# Patient Record
Sex: Female | Born: 1980 | Race: White | Hispanic: No | Marital: Married | Smoking: Former smoker
Health system: Southern US, Community
[De-identification: ages and names within clinical notes are randomized; demographics above are authoritative.]

## PROBLEM LIST (undated history)

## (undated) DIAGNOSIS — E7800 Pure hypercholesterolemia, unspecified: Secondary | ICD-10-CM

## (undated) DIAGNOSIS — E559 Vitamin D deficiency, unspecified: Secondary | ICD-10-CM

## (undated) DIAGNOSIS — E119 Type 2 diabetes mellitus without complications: Secondary | ICD-10-CM

## (undated) DIAGNOSIS — I10 Essential (primary) hypertension: Secondary | ICD-10-CM

## (undated) HISTORY — DX: Vitamin D deficiency, unspecified: E55.9

## (undated) HISTORY — DX: Pure hypercholesterolemia, unspecified: E78.00

## (undated) HISTORY — PX: WISDOM TOOTH EXTRACTION: SHX21

---

## 2020-05-01 ENCOUNTER — Encounter (HOSPITAL_COMMUNITY): Payer: Self-pay | Admitting: Obstetrics and Gynecology

## 2020-05-01 ENCOUNTER — Inpatient Hospital Stay (HOSPITAL_COMMUNITY): Payer: 59 | Admitting: Anesthesiology

## 2020-05-01 ENCOUNTER — Inpatient Hospital Stay (HOSPITAL_BASED_OUTPATIENT_CLINIC_OR_DEPARTMENT_OTHER): Payer: 59

## 2020-05-01 ENCOUNTER — Encounter (HOSPITAL_COMMUNITY)
Admission: AD | Disposition: A | Discharge status: Home / Self Care | Payer: Self-pay | Source: Home / Self Care | Attending: Obstetrics and Gynecology

## 2020-05-01 ENCOUNTER — Inpatient Hospital Stay (HOSPITAL_COMMUNITY)
Admission: AD | Admit: 2020-05-01 | Discharge: 2020-05-05 | DRG: 787 | Disposition: A | Discharge status: Home / Self Care | Payer: 59 | Attending: Obstetrics and Gynecology | Admitting: Obstetrics and Gynecology

## 2020-05-01 ENCOUNTER — Other Ambulatory Visit: Payer: Self-pay

## 2020-05-01 DIAGNOSIS — O368330 Maternal care for abnormalities of the fetal heart rate or rhythm, third trimester, not applicable or unspecified: Secondary | ICD-10-CM | POA: Diagnosis not present

## 2020-05-01 DIAGNOSIS — Z9889 Other specified postprocedural states: Secondary | ICD-10-CM

## 2020-05-01 DIAGNOSIS — O09513 Supervision of elderly primigravida, third trimester: Secondary | ICD-10-CM

## 2020-05-01 DIAGNOSIS — O114 Pre-existing hypertension with pre-eclampsia, complicating childbirth: Secondary | ICD-10-CM | POA: Diagnosis present

## 2020-05-01 DIAGNOSIS — O4103X0 Oligohydramnios, third trimester, not applicable or unspecified: Secondary | ICD-10-CM | POA: Diagnosis present

## 2020-05-01 DIAGNOSIS — Z364 Encounter for antenatal screening for fetal growth retardation: Secondary | ICD-10-CM | POA: Diagnosis not present

## 2020-05-01 DIAGNOSIS — R030 Elevated blood-pressure reading, without diagnosis of hypertension: Secondary | ICD-10-CM | POA: Diagnosis not present

## 2020-05-01 DIAGNOSIS — Z3A32 32 weeks gestation of pregnancy: Secondary | ICD-10-CM

## 2020-05-01 DIAGNOSIS — O24420 Gestational diabetes mellitus in childbirth, diet controlled: Secondary | ICD-10-CM | POA: Diagnosis present

## 2020-05-01 DIAGNOSIS — O365930 Maternal care for other known or suspected poor fetal growth, third trimester, not applicable or unspecified: Secondary | ICD-10-CM

## 2020-05-01 DIAGNOSIS — O368130 Decreased fetal movements, third trimester, not applicable or unspecified: Principal | ICD-10-CM

## 2020-05-01 DIAGNOSIS — Z20822 Contact with and (suspected) exposure to covid-19: Secondary | ICD-10-CM | POA: Diagnosis present

## 2020-05-01 DIAGNOSIS — O365990 Maternal care for other known or suspected poor fetal growth, unspecified trimester, not applicable or unspecified: Secondary | ICD-10-CM

## 2020-05-01 DIAGNOSIS — O10013 Pre-existing essential hypertension complicating pregnancy, third trimester: Secondary | ICD-10-CM

## 2020-05-01 DIAGNOSIS — O368190 Decreased fetal movements, unspecified trimester, not applicable or unspecified: Secondary | ICD-10-CM

## 2020-05-01 DIAGNOSIS — O1002 Pre-existing essential hypertension complicating childbirth: Secondary | ICD-10-CM | POA: Diagnosis present

## 2020-05-01 DIAGNOSIS — O1493 Unspecified pre-eclampsia, third trimester: Secondary | ICD-10-CM

## 2020-05-01 DIAGNOSIS — O1490 Unspecified pre-eclampsia, unspecified trimester: Secondary | ICD-10-CM

## 2020-05-01 DIAGNOSIS — O99893 Other specified diseases and conditions complicating puerperium: Secondary | ICD-10-CM | POA: Diagnosis not present

## 2020-05-01 DIAGNOSIS — O368390 Maternal care for abnormalities of the fetal heart rate or rhythm, unspecified trimester, not applicable or unspecified: Secondary | ICD-10-CM

## 2020-05-01 DIAGNOSIS — O24419 Gestational diabetes mellitus in pregnancy, unspecified control: Secondary | ICD-10-CM

## 2020-05-01 DIAGNOSIS — Z87891 Personal history of nicotine dependence: Secondary | ICD-10-CM | POA: Diagnosis not present

## 2020-05-01 HISTORY — DX: Essential (primary) hypertension: I10

## 2020-05-01 HISTORY — DX: Type 2 diabetes mellitus without complications: E11.9

## 2020-05-01 LAB — CBC
HCT: 43.3 % (ref 36.0–46.0)
Hemoglobin: 14.7 g/dL (ref 12.0–15.0)
MCH: 32.3 pg (ref 26.0–34.0)
MCHC: 33.9 g/dL (ref 30.0–36.0)
MCV: 95.2 fL (ref 80.0–100.0)
Platelets: 241 10*3/uL (ref 150–400)
RBC: 4.55 MIL/uL (ref 3.87–5.11)
RDW: 11.9 % (ref 11.5–15.5)
WBC: 15.9 10*3/uL — ABNORMAL HIGH (ref 4.0–10.5)
nRBC: 0 % (ref 0.0–0.2)

## 2020-05-01 LAB — PROTEIN / CREATININE RATIO, URINE
Creatinine, Urine: 85.58 mg/dL
Protein Creatinine Ratio: 5.47 mg/mg{Cre} — ABNORMAL HIGH (ref 0.00–0.15)
Total Protein, Urine: 468 mg/dL

## 2020-05-01 LAB — COMPREHENSIVE METABOLIC PANEL
ALT: 32 U/L (ref 0–44)
AST: 26 U/L (ref 15–41)
Albumin: 2.9 g/dL — ABNORMAL LOW (ref 3.5–5.0)
Alkaline Phosphatase: 124 U/L (ref 38–126)
Anion gap: 13 (ref 5–15)
BUN: 9 mg/dL (ref 6–20)
CO2: 21 mmol/L — ABNORMAL LOW (ref 22–32)
Calcium: 9.1 mg/dL (ref 8.9–10.3)
Chloride: 100 mmol/L (ref 98–111)
Creatinine, Ser: 0.66 mg/dL (ref 0.44–1.00)
GFR calc Af Amer: >60 mL/min (ref >60)
GFR calc non Af Amer: >60 mL/min (ref >60)
Glucose, Bld: 84 mg/dL (ref 70–99)
Potassium: 4 mmol/L (ref 3.5–5.1)
Sodium: 134 mmol/L — ABNORMAL LOW (ref 135–145)
Total Bilirubin: 0.7 mg/dL (ref 0.3–1.2)
Total Protein: 6.1 g/dL — ABNORMAL LOW (ref 6.5–8.1)

## 2020-05-01 LAB — URINALYSIS, ROUTINE W REFLEX MICROSCOPIC
Bilirubin Urine: NEGATIVE
Glucose, UA: NEGATIVE mg/dL
Hgb urine dipstick: NEGATIVE
Ketones, ur: NEGATIVE mg/dL
Leukocytes,Ua: NEGATIVE
Nitrite: NEGATIVE
Protein, ur: >=300 mg/dL — AB
Specific Gravity, Urine: 1.011 (ref 1.005–1.030)
pH: 5 (ref 5.0–8.0)

## 2020-05-01 LAB — SARS CORONAVIRUS 2 BY RT PCR (HOSPITAL ORDER, PERFORMED IN ~~LOC~~ HOSPITAL LAB): SARS Coronavirus 2: NEGATIVE

## 2020-05-01 LAB — TYPE AND SCREEN
ABO/RH(D): O POS
Antibody Screen: NEGATIVE

## 2020-05-01 LAB — GLUCOSE, CAPILLARY: Glucose-Capillary: 72 mg/dL (ref 70–99)

## 2020-05-01 LAB — HEPATITIS B SURFACE ANTIGEN: Hepatitis B Surface Ag: NONREACTIVE

## 2020-05-01 SURGERY — Surgical Case
Anesthesia: Spinal | Site: Abdomen | Wound class: Clean Contaminated

## 2020-05-01 MED ORDER — HYDROMORPHONE HCL 1 MG/ML IJ SOLN: 1.0000 mg | INTRAMUSCULAR | Status: DC | PRN

## 2020-05-01 MED ORDER — LACTATED RINGERS IV SOLN: INTRAVENOUS | Status: DC

## 2020-05-01 MED ORDER — LABETALOL HCL 5 MG/ML IV SOLN: 20.0000 mg | INTRAVENOUS | Status: DC | PRN

## 2020-05-01 MED ORDER — ENOXAPARIN SODIUM 40 MG/0.4ML ~~LOC~~ SOLN
40.0000 mg | SUBCUTANEOUS | Status: DC
  Administered 2020-05-02 – 2020-05-04 (×3): 40 mg via SUBCUTANEOUS
  Filled 2020-05-01 (×4): qty 0.4

## 2020-05-01 MED ORDER — LABETALOL HCL 5 MG/ML IV SOLN
INTRAVENOUS | Status: AC
  Administered 2020-05-01: 20 mg via INTRAVENOUS
  Filled 2020-05-01: qty 4

## 2020-05-01 MED ORDER — MAGNESIUM SULFATE 40 GM/1000ML IV SOLN
2.0000 g/h | INTRAVENOUS | Status: AC
  Administered 2020-05-02: 2 g/h via INTRAVENOUS
  Filled 2020-05-01 (×2): qty 1000

## 2020-05-01 MED ORDER — HYDROMORPHONE HCL 1 MG/ML IJ SOLN: 0.2500 mg | INTRAMUSCULAR | Status: DC | PRN

## 2020-05-01 MED ORDER — ZOLPIDEM TARTRATE 5 MG PO TABS: 5.0000 mg | ORAL_TABLET | Freq: Every evening | ORAL | Status: DC | PRN

## 2020-05-01 MED ORDER — DIPHENHYDRAMINE HCL 25 MG PO CAPS: 25.0000 mg | ORAL_CAPSULE | ORAL | Status: DC | PRN

## 2020-05-01 MED ORDER — PRENATAL MULTIVITAMIN CH
1.0000 | ORAL_TABLET | Freq: Every day | ORAL | Status: DC
  Administered 2020-05-02 – 2020-05-04 (×3): 1 via ORAL
  Filled 2020-05-01 (×3): qty 1

## 2020-05-01 MED ORDER — KETOROLAC TROMETHAMINE 30 MG/ML IJ SOLN: 15.0000 mg | INTRAMUSCULAR | Status: DC

## 2020-05-01 MED ORDER — MORPHINE SULFATE (PF) 0.5 MG/ML IJ SOLN
INTRAMUSCULAR | Status: AC
  Filled 2020-05-01: qty 10

## 2020-05-01 MED ORDER — WITCH HAZEL-GLYCERIN EX PADS: 1.0000 "application " | MEDICATED_PAD | CUTANEOUS | Status: DC | PRN

## 2020-05-01 MED ORDER — OXYTOCIN-SODIUM CHLORIDE 30-0.9 UT/500ML-% IV SOLN: 2.5000 [IU]/h | INTRAVENOUS | Status: AC

## 2020-05-01 MED ORDER — CEFAZOLIN SODIUM-DEXTROSE 2-4 GM/100ML-% IV SOLN
INTRAVENOUS | Status: AC
  Filled 2020-05-01: qty 100

## 2020-05-01 MED ORDER — POVIDONE-IODINE 10 % EX SWAB: 2.0000 "application " | Freq: Once | CUTANEOUS | Status: DC

## 2020-05-01 MED ORDER — FAMOTIDINE 20 MG PO TABS
20.0000 mg | ORAL_TABLET | Freq: Every day | ORAL | Status: DC
  Administered 2020-05-02 – 2020-05-04 (×3): 20 mg via ORAL
  Filled 2020-05-01 (×3): qty 1

## 2020-05-01 MED ORDER — NALBUPHINE HCL 10 MG/ML IJ SOLN: 5.0000 mg | INTRAMUSCULAR | Status: DC | PRN

## 2020-05-01 MED ORDER — KETOROLAC TROMETHAMINE 30 MG/ML IJ SOLN
INTRAMUSCULAR | Status: AC
  Filled 2020-05-01: qty 1

## 2020-05-01 MED ORDER — FENTANYL CITRATE (PF) 100 MCG/2ML IJ SOLN
INTRAMUSCULAR | Status: DC | PRN
Start: 2020-05-01 — End: 2020-05-01
  Administered 2020-05-01: 15 ug via INTRAVENOUS

## 2020-05-01 MED ORDER — SODIUM CHLORIDE 0.9 % IR SOLN
Status: DC | PRN
  Administered 2020-05-01: 1000 mL

## 2020-05-01 MED ORDER — NALBUPHINE HCL 10 MG/ML IJ SOLN: 5.0000 mg | Freq: Once | INTRAMUSCULAR | Status: DC | PRN

## 2020-05-01 MED ORDER — SIMETHICONE 80 MG PO CHEW
80.0000 mg | CHEWABLE_TABLET | Freq: Three times a day (TID) | ORAL | Status: DC
  Administered 2020-05-02 – 2020-05-05 (×8): 80 mg via ORAL
  Filled 2020-05-01 (×8): qty 1

## 2020-05-01 MED ORDER — BUPIVACAINE IN DEXTROSE 0.75-8.25 % IT SOLN
INTRATHECAL | Status: DC | PRN
  Administered 2020-05-01: 1.6 mg via INTRATHECAL

## 2020-05-01 MED ORDER — PHENYLEPHRINE 40 MCG/ML (10ML) SYRINGE FOR IV PUSH (FOR BLOOD PRESSURE SUPPORT)
PREFILLED_SYRINGE | INTRAVENOUS | Status: DC | PRN
  Administered 2020-05-01 (×3): 80 ug via INTRAVENOUS

## 2020-05-01 MED ORDER — ENALAPRIL MALEATE 5 MG PO TABS
5.0000 mg | ORAL_TABLET | Freq: Every day | ORAL | Status: DC
  Administered 2020-05-02: 5 mg via ORAL
  Filled 2020-05-01: qty 1

## 2020-05-01 MED ORDER — CEFAZOLIN SODIUM-DEXTROSE 2-4 GM/100ML-% IV SOLN
2.0000 g | INTRAVENOUS | Status: AC
  Administered 2020-05-01: 2 g via INTRAVENOUS
  Filled 2020-05-01: qty 100

## 2020-05-01 MED ORDER — ONDANSETRON HCL 4 MG/2ML IJ SOLN
INTRAMUSCULAR | Status: AC
  Filled 2020-05-01: qty 2

## 2020-05-01 MED ORDER — STERILE WATER FOR IRRIGATION IR SOLN
Status: DC | PRN
  Administered 2020-05-01: 1000 mL

## 2020-05-01 MED ORDER — SOD CITRATE-CITRIC ACID 500-334 MG/5ML PO SOLN
30.0000 mL | ORAL | Status: AC
  Administered 2020-05-01: 30 mL via ORAL
  Filled 2020-05-01: qty 30

## 2020-05-01 MED ORDER — ACETAMINOPHEN 500 MG PO TABS: 1000.0000 mg | ORAL_TABLET | ORAL | Status: DC

## 2020-05-01 MED ORDER — MORPHINE SULFATE (PF) 0.5 MG/ML IJ SOLN
INTRAMUSCULAR | Status: DC | PRN
Start: 2020-05-01 — End: 2020-05-01
  Administered 2020-05-01: .15 mg via EPIDURAL

## 2020-05-01 MED ORDER — OXYTOCIN-SODIUM CHLORIDE 30-0.9 UT/500ML-% IV SOLN
INTRAVENOUS | Status: DC | PRN
  Administered 2020-05-01: 300 mL via INTRAVENOUS

## 2020-05-01 MED ORDER — DIBUCAINE (PERIANAL) 1 % EX OINT: 1.0000 "application " | TOPICAL_OINTMENT | CUTANEOUS | Status: DC | PRN

## 2020-05-01 MED ORDER — BETAMETHASONE SOD PHOS & ACET 6 (3-3) MG/ML IJ SUSP
12.0000 mg | Freq: Once | INTRAMUSCULAR | Status: AC
  Administered 2020-05-01: 12 mg via INTRAMUSCULAR
  Filled 2020-05-01: qty 5

## 2020-05-01 MED ORDER — SCOPOLAMINE 1 MG/3DAYS TD PT72: 1.0000 | MEDICATED_PATCH | Freq: Once | TRANSDERMAL | Status: DC

## 2020-05-01 MED ORDER — NALOXONE HCL 0.4 MG/ML IJ SOLN: 0.4000 mg | INTRAMUSCULAR | Status: DC | PRN

## 2020-05-01 MED ORDER — VENLAFAXINE HCL ER 150 MG PO CP24
150.0000 mg | ORAL_CAPSULE | Freq: Every day | ORAL | Status: DC
  Administered 2020-05-02 – 2020-05-05 (×4): 150 mg via ORAL
  Filled 2020-05-01 (×4): qty 1

## 2020-05-01 MED ORDER — MEPERIDINE HCL 25 MG/ML IJ SOLN: 6.2500 mg | INTRAMUSCULAR | Status: DC | PRN

## 2020-05-01 MED ORDER — MAGNESIUM SULFATE BOLUS VIA INFUSION
4.0000 g | Freq: Once | INTRAVENOUS | Status: AC
  Administered 2020-05-01: 4 g via INTRAVENOUS
  Filled 2020-05-01: qty 1000

## 2020-05-01 MED ORDER — LABETALOL HCL 5 MG/ML IV SOLN: 80.0000 mg | INTRAVENOUS | Status: DC | PRN

## 2020-05-01 MED ORDER — SIMETHICONE 80 MG PO CHEW: 80.0000 mg | CHEWABLE_TABLET | ORAL | Status: DC | PRN

## 2020-05-01 MED ORDER — PHENYLEPHRINE HCL-NACL 20-0.9 MG/250ML-% IV SOLN
INTRAVENOUS | Status: AC
  Filled 2020-05-01: qty 250

## 2020-05-01 MED ORDER — SODIUM CHLORIDE 0.9% FLUSH: 3.0000 mL | INTRAVENOUS | Status: DC | PRN

## 2020-05-01 MED ORDER — KETOROLAC TROMETHAMINE 30 MG/ML IJ SOLN
30.0000 mg | Freq: Once | INTRAMUSCULAR | Status: AC | PRN
  Administered 2020-05-01: 30 mg via INTRAVENOUS

## 2020-05-01 MED ORDER — MENTHOL 3 MG MT LOZG: 1.0000 | LOZENGE | OROMUCOSAL | Status: DC | PRN

## 2020-05-01 MED ORDER — HYDRALAZINE HCL 20 MG/ML IJ SOLN: 10.0000 mg | INTRAMUSCULAR | Status: DC | PRN

## 2020-05-01 MED ORDER — DIPHENHYDRAMINE HCL 25 MG PO CAPS: 25.0000 mg | ORAL_CAPSULE | Freq: Four times a day (QID) | ORAL | Status: DC | PRN

## 2020-05-01 MED ORDER — ONDANSETRON HCL 4 MG/2ML IJ SOLN: 4.0000 mg | Freq: Three times a day (TID) | INTRAMUSCULAR | Status: DC | PRN

## 2020-05-01 MED ORDER — OXYTOCIN-SODIUM CHLORIDE 30-0.9 UT/500ML-% IV SOLN
INTRAVENOUS | Status: AC
  Filled 2020-05-01: qty 500

## 2020-05-01 MED ORDER — FAMOTIDINE IN NACL 20-0.9 MG/50ML-% IV SOLN: 20.0000 mg | Freq: Once | INTRAVENOUS | Status: AC

## 2020-05-01 MED ORDER — DIPHENHYDRAMINE HCL 50 MG/ML IJ SOLN: 12.5000 mg | INTRAMUSCULAR | Status: DC | PRN

## 2020-05-01 MED ORDER — IBUPROFEN 800 MG PO TABS
800.0000 mg | ORAL_TABLET | Freq: Three times a day (TID) | ORAL | Status: DC
  Administered 2020-05-03 – 2020-05-05 (×8): 800 mg via ORAL
  Filled 2020-05-01 (×8): qty 1

## 2020-05-01 MED ORDER — FENTANYL CITRATE (PF) 100 MCG/2ML IJ SOLN
INTRAMUSCULAR | Status: AC
  Filled 2020-05-01: qty 2

## 2020-05-01 MED ORDER — LABETALOL HCL 5 MG/ML IV SOLN
40.0000 mg | INTRAVENOUS | Status: DC | PRN
  Administered 2020-05-01: 40 mg via INTRAVENOUS
  Filled 2020-05-01: qty 8

## 2020-05-01 MED ORDER — FAMOTIDINE IN NACL 20-0.9 MG/50ML-% IV SOLN
INTRAVENOUS | Status: AC
  Administered 2020-05-01: 20 mg
  Filled 2020-05-01: qty 50

## 2020-05-01 MED ORDER — SENNOSIDES-DOCUSATE SODIUM 8.6-50 MG PO TABS
2.0000 | ORAL_TABLET | ORAL | Status: DC
  Administered 2020-05-02 – 2020-05-04 (×4): 2 via ORAL
  Filled 2020-05-01 (×4): qty 2

## 2020-05-01 MED ORDER — ONDANSETRON HCL 4 MG/2ML IJ SOLN
INTRAMUSCULAR | Status: DC | PRN
  Administered 2020-05-01: 4 mg via INTRAVENOUS

## 2020-05-01 MED ORDER — TETANUS-DIPHTH-ACELL PERTUSSIS 5-2.5-18.5 LF-MCG/0.5 IM SUSP: 0.5000 mL | Freq: Once | INTRAMUSCULAR | Status: DC

## 2020-05-01 MED ORDER — NALOXONE HCL 4 MG/10ML IJ SOLN
1.0000 ug/kg/h | INTRAVENOUS | Status: DC | PRN
  Filled 2020-05-01: qty 5

## 2020-05-01 MED ORDER — COCONUT OIL OIL: 1.0000 "application " | TOPICAL_OIL | Status: DC | PRN

## 2020-05-01 MED ORDER — OXYCODONE-ACETAMINOPHEN 5-325 MG PO TABS
1.0000 | ORAL_TABLET | ORAL | Status: DC | PRN
  Administered 2020-05-02 – 2020-05-05 (×3): 1 via ORAL
  Filled 2020-05-01 (×3): qty 1

## 2020-05-01 MED ORDER — KETOROLAC TROMETHAMINE 30 MG/ML IJ SOLN
30.0000 mg | Freq: Four times a day (QID) | INTRAMUSCULAR | Status: AC
  Administered 2020-05-02 (×4): 30 mg via INTRAVENOUS
  Filled 2020-05-01 (×4): qty 1

## 2020-05-01 MED ORDER — PROMETHAZINE HCL 25 MG/ML IJ SOLN: 6.2500 mg | INTRAMUSCULAR | Status: DC | PRN

## 2020-05-01 MED ORDER — SIMETHICONE 80 MG PO CHEW
80.0000 mg | CHEWABLE_TABLET | ORAL | Status: DC
  Administered 2020-05-02 – 2020-05-04 (×4): 80 mg via ORAL
  Filled 2020-05-01 (×4): qty 1

## 2020-05-01 MED ORDER — OXYCODONE-ACETAMINOPHEN 5-325 MG PO TABS: 2.0000 | ORAL_TABLET | ORAL | Status: DC | PRN

## 2020-05-01 MED ORDER — PHENYLEPHRINE HCL-NACL 20-0.9 MG/250ML-% IV SOLN
INTRAVENOUS | Status: DC | PRN
  Administered 2020-05-01: 20 ug/min via INTRAVENOUS

## 2020-05-01 SURGICAL SUPPLY — 39 items
BENZOIN TINCTURE PRP APPL 2/3 (GAUZE/BANDAGES/DRESSINGS) ×3 IMPLANT
CHLORAPREP W/TINT 26ML (MISCELLANEOUS) ×3 IMPLANT
CLAMP CORD UMBIL (MISCELLANEOUS) IMPLANT
CLOSURE WOUND 1/2 X4 (GAUZE/BANDAGES/DRESSINGS) ×1
CLOTH BEACON ORANGE TIMEOUT ST (SAFETY) ×3 IMPLANT
DRAPE C SECTION CLR SCREEN (DRAPES) IMPLANT
DRSG OPSITE POSTOP 4X10 (GAUZE/BANDAGES/DRESSINGS) ×3 IMPLANT
ELECT REM PT RETURN 9FT ADLT (ELECTROSURGICAL) ×3
ELECTRODE REM PT RTRN 9FT ADLT (ELECTROSURGICAL) ×1 IMPLANT
EXTRACTOR VACUUM M CUP 4 TUBE (SUCTIONS) IMPLANT
EXTRACTOR VACUUM M CUP 4' TUBE (SUCTIONS)
GLOVE BIO SURGEON STRL SZ7.5 (GLOVE) ×3 IMPLANT
GLOVE BIOGEL PI IND STRL 7.0 (GLOVE) ×1 IMPLANT
GLOVE BIOGEL PI INDICATOR 7.0 (GLOVE) ×2
GOWN STRL REUS W/TWL 2XL LVL3 (GOWN DISPOSABLE) ×3 IMPLANT
GOWN STRL REUS W/TWL LRG LVL3 (GOWN DISPOSABLE) ×6 IMPLANT
KIT ABG SYR 3ML LUER SLIP (SYRINGE) IMPLANT
NEEDLE HYPO 22GX1.5 SAFETY (NEEDLE) ×3 IMPLANT
NEEDLE HYPO 25X5/8 SAFETYGLIDE (NEEDLE) IMPLANT
NS IRRIG 1000ML POUR BTL (IV SOLUTION) ×3 IMPLANT
PACK C SECTION WH (CUSTOM PROCEDURE TRAY) ×3 IMPLANT
PAD OB MATERNITY 4.3X12.25 (PERSONAL CARE ITEMS) ×3 IMPLANT
PENCIL SMOKE EVAC W/HOLSTER (ELECTROSURGICAL) ×3 IMPLANT
RTRCTR C-SECT PINK 25CM LRG (MISCELLANEOUS) ×3 IMPLANT
STRIP CLOSURE SKIN 1/2X4 (GAUZE/BANDAGES/DRESSINGS) ×2 IMPLANT
SUT CHROMIC 1 CTX 36 (SUTURE) ×6 IMPLANT
SUT VIC AB 1 CT1 36 (SUTURE) ×6 IMPLANT
SUT VIC AB 2-0 CT1 (SUTURE) ×3 IMPLANT
SUT VIC AB 2-0 CT1 27 (SUTURE) ×2
SUT VIC AB 2-0 CT1 TAPERPNT 27 (SUTURE) ×1 IMPLANT
SUT VIC AB 3-0 CT1 27 (SUTURE) ×4
SUT VIC AB 3-0 CT1 TAPERPNT 27 (SUTURE) ×2 IMPLANT
SUT VIC AB 3-0 SH 27 (SUTURE)
SUT VIC AB 3-0 SH 27X BRD (SUTURE) IMPLANT
SUT VIC AB 4-0 KS 27 (SUTURE) ×3 IMPLANT
SYR BULB IRRIGATION 50ML (SYRINGE) IMPLANT
TOWEL OR 17X24 6PK STRL BLUE (TOWEL DISPOSABLE) ×3 IMPLANT
TRAY FOLEY W/BAG SLVR 14FR LF (SET/KITS/TRAYS/PACK) ×3 IMPLANT
WATER STERILE IRR 1000ML POUR (IV SOLUTION) ×3 IMPLANT

## 2020-05-01 NOTE — Op Note (Signed)
Cesarean Section Procedure Note  05/01/2020  4:07 PM  PATIENT:  Nancy Benjamin  39 y.o. female  PRE-OPERATIVE DIAGNOSIS:  intrauterine growth restriction, oligohydramnios, non reassuring fetal heart tracing  POST-OPERATIVE DIAGNOSIS:  intrauterine growth restriction, oligohydramnios, non reassuring fetal heart tracing  PROCEDURE:  Procedure(s): CESAREAN SECTION (N/A)  SURGEON:  Surgeon(s) and Role:    * Hermina Staggers, MD - Primary  ASSISTANTS: none   ANESTHESIA:   spinal  EBL:  Total I/O In: 1006.5 [I.V.:1006.5] Out: 701 [Urine:550; Blood:151]  BLOOD ADMINISTERED:none  DRAINS: none   LOCAL MEDICATIONS USED:  NONE  SPECIMEN:  Source of Specimen:  Placenta  DISPOSITION OF SPECIMEN:  PATHOLOGY   Procedure Details   The patient was seen in the Holding Room. The risks, benefits, complications, treatment options, and expected outcomes were discussed with the patient.  The patient concurred with the proposed plan, giving informed consent.  The site of surgery properly noted/marked. The patient was taken to Operating Room # B, identified as Terri Rorrer and the procedure verified as C-Section Delivery. A Time Out was held and the above information confirmed.  After induction of anesthesia, the patient was draped and prepped in the usual sterile manner. A Pfannenstiel incision was made and carried down through the subcutaneous tissue to the fascia. Fascial incision was made and extended transversely. The fascia was separated from the underlying rectus tissue superiorly and inferiorly. The peritoneum was identified and entered. Peritoneal incision was extended longitudinally. The utero-vesical peritoneal reflection was incised transversely and the bladder flap was bluntly freed from the lower uterine segment. A low transverse uterine incision was made. Delivered viable female infant with Apgars and weight as recorded. After the umbilical cord was clamped and cut cord blood was  obtained for evaluation. The placenta was removed intact and appeared normal. The uterine outline, tubes and ovaries appeared normal. The uterine incision was closed with running locked sutures of Chromic x 2. Hemostasis was observed. Lavage was carried out until clear. The fascia was then reapproximated with running sutures of 1/0. The skin was reapproximated with Vicryl.  Instrument, sponge, and needle counts were correct prior the abdominal closure and at the conclusion of the case.   Complications:  None; patient tolerated the procedure well.  COUNTS:  YES  PLAN OF CARE: Admit to inpatient   PATIENT DISPOSITION:  PACU - hemodynamically stable.   Delay start of Pharmacological VTE agent (>24hrs) due to surgical blood loss or risk of bleeding: not applicable             Disposition: PACU - hemodynamically stable.         Condition: stable   Hermina Staggers, MD 05/01/2020 4:07 PM

## 2020-05-01 NOTE — MAU Note (Signed)
Nancy Benjamin is a 39 y.o. at [redacted]w[redacted]d here in MAU reporting: DFM since yesterday. States she did doppler this AM and heard a heartbeat but she has not felt any movement yet this morning. No pain, bleeding, discharge, or LOF.  States has been getting care in [place] and [place], is planning to go to [facility] here. States she has CHTN and was dx with GDM. States at 30 weeks was diagnosed with preeclampsia. Also IUGR with this pregnancy.  Onset of complaint: yesterday  Pain score:  0/10  Vitals:   05/01/20 0748  BP: (!) 164/111  Pulse: 74  Resp: 16  Temp: 98.3 F (36.8 C)  SpO2: 100%     FHT: 141  Lab orders placed from triage: UA

## 2020-05-01 NOTE — MAU Note (Signed)
Release of Medical Records form completed and faxed to [facility].

## 2020-05-01 NOTE — Anesthesia Postprocedure Evaluation (Signed)
Anesthesia Post Note  Patient: Nancy Benjamin  Procedure(s) Performed: CESAREAN SECTION (N/A Abdomen)     Patient location during evaluation: PACU Anesthesia Type: Spinal Level of consciousness: awake and alert Pain management: pain level controlled Vital Signs Assessment: post-procedure vital signs reviewed and stable Respiratory status: spontaneous breathing, nonlabored ventilation and respiratory function stable Cardiovascular status: stable Postop Assessment: no headache, no backache and epidural receding Anesthetic complications: no   No complications documented.  Last Vitals:  Vitals:   05/01/20 1851 05/01/20 1904  BP: 134/90   Pulse: 78   Resp: 17   Temp:  (!) 35.7 C  SpO2: 100%     Last Pain:  Vitals:   05/01/20 1904  TempSrc: Axillary  PainSc:    Pain Goal: Patients Stated Pain Goal: 2 (05/01/20 1851)              Epidural/Spinal Function Cutaneous sensation: Able to Wiggle Toes (05/01/20 1851), Patient able to flex knees: Yes (05/01/20 1851), Patient able to lift hips off bed: Yes (05/01/20 1851), Back pain beyond tenderness at insertion site: No (05/01/20 1851), Progressively worsening motor and/or sensory loss: No (05/01/20 1851), Bowel and/or bladder incontinence post epidural: No (05/01/20 1851)  Lewie Loron

## 2020-05-01 NOTE — Anesthesia Preprocedure Evaluation (Addendum)
Anesthesia Evaluation  Patient identified by MRN, date of birth, ID band  Reviewed: Allergy & Precautions, NPO status , Patient's Chart, lab work & pertinent test results  Airway Mallampati: II  TM Distance: >3 FB Neck ROM: Full    Dental no notable dental hx. (+) Dental Advisory Given   Pulmonary neg pulmonary ROS,    Pulmonary exam normal breath sounds clear to auscultation       Cardiovascular hypertension, Normal cardiovascular exam Rhythm:Regular Rate:Normal     Neuro/Psych negative neurological ROS     GI/Hepatic negative GI ROS, Neg liver ROS,   Endo/Other  negative endocrine ROSdiabetes  Renal/GU negative Renal ROS     Musculoskeletal negative musculoskeletal ROS (+)   Abdominal (+) + obese,   Peds  Hematology negative hematology ROS (+)   Anesthesia Other Findings   Reproductive/Obstetrics (+) Pregnancy                           Anesthesia Physical Anesthesia Plan  ASA: III  Anesthesia Plan: Spinal   Post-op Pain Management:    Induction:   PONV Risk Score and Plan: 3 and Ondansetron, Dexamethasone, Scopolamine patch - Pre-op and Treatment may vary due to age or medical condition  Airway Management Planned: Natural Airway  Additional Equipment:   Intra-op Plan:   Post-operative Plan:   Informed Consent: I have reviewed the patients History and Physical, chart, labs and discussed the procedure including the risks, benefits and alternatives for the proposed anesthesia with the patient or authorized representative who has indicated his/her understanding and acceptance.       Plan Discussed with: CRNA  Anesthesia Plan Comments:        Anesthesia Quick Evaluation

## 2020-05-01 NOTE — MAU Provider Note (Signed)
History     CSN: [id number]  Arrival date and time: 05/01/20 6314   First Provider Initiated Contact with Patient 05/01/20 0830      Chief Complaint  Patient presents with  . Decreased Fetal Movement   HPI  Ms.Fidencia Presutti is a 39 y.o. female G1P0 @ [redacted]w[redacted]d here in MAU with complaints with decreased fetal movement. She has CHTN; she is taking metoprolol 200 mg BID. Last dose was this morning. Taking BASA daily. Reports decreased fetal movement in the last 10 hours. Was receiving care in [place] and did not like the way she was cared for. She has not been seen in that office in 4 weeks.   No changes in vision or HA. Off and on HA; mild in the last few days.   Currently Gestational diabetic; diet controlled.   Admitted d/t preeclampsia and signed out AMA in the last 2 weeks. She was in [facility].   Reports IUGR with abnormal dopplers.  Reviewed 2 doses of BMZ @ 24 weeks.   OB History    Gravida  1   Para      Term      Preterm      AB      Living        SAB      TAB      Ectopic      Multiple      Live Births              Past Medical History:  Diagnosis Date  . Diabetes mellitus without complication (HCC)    gestational  . Hypertension    patient reports htn since "mid 20's"    No family history on file.  Social History   Tobacco Use  . Smoking status: Not on file  Substance Use Topics  . Alcohol use: Not on file  . Drug use: Not on file    Allergies:  Allergies  Allergen Reactions  . Nickel Rash  . Other Rash    Robitussin     No medications prior to admission.   Results for orders placed or performed during the hospital encounter of 05/01/20 (from the past 48 hour(s))  CBC     Status: Abnormal   Collection Time: 05/01/20  8:12 AM  Result Value Ref Range   WBC 15.9 (H) 4.0 - 10.5 K/uL   RBC 4.55 3.87 - 5.11 MIL/uL   Hemoglobin 14.7 12.0 - 15.0 g/dL   HCT 97.0 36 - 46 %   MCV 95.2 80.0 - 100.0 fL   MCH 32.3 26.0 - 34.0  pg   MCHC 33.9 30.0 - 36.0 g/dL   RDW 26.3 78.5 - 88.5 %   Platelets 241 150 - 400 K/uL    Comment: REPEATED TO VERIFY   nRBC 0.0 0.0 - 0.2 %    Comment: Performed at [facility], [address], [place], [place] [address]  Comprehensive metabolic panel     Status: Abnormal   Collection Time: 05/01/20  8:12 AM  Result Value Ref Range   Sodium 134 (L) 135 - 145 mmol/L   Potassium 4.0 3.5 - 5.1 mmol/L   Chloride 100 98 - 111 mmol/L   CO2 21 (L) 22 - 32 mmol/L   Glucose, Bld 84 70 - 99 mg/dL    Comment: Glucose reference range applies only to samples taken after fasting for at least 8 hours.   BUN 9 6 - 20 mg/dL   Creatinine,  Ser 0.66 0.44 - 1.00 mg/dL   Calcium 9.1 8.9 - 07.6 mg/dL   Total Protein 6.1 (L) 6.5 - 8.1 g/dL   Albumin 2.9 (L) 3.5 - 5.0 g/dL   AST 26 15 - 41 U/L   ALT 32 0 - 44 U/L   Alkaline Phosphatase 124 38 - 126 U/L   Total Bilirubin 0.7 0.3 - 1.2 mg/dL   GFR calc non Af Amer >60 >60 mL/min   GFR calc Af Amer >60 >60 mL/min   Anion gap 13 5 - 15    Comment: Performed at [facility], [address], [place], [place] [address]  Urinalysis, Routine w reflex microscopic Urine, Clean Catch     Status: Abnormal   Collection Time: 05/01/20  8:17 AM  Result Value Ref Range   Color, Urine YELLOW YELLOW   APPearance CLOUDY (A) CLEAR   Specific Gravity, Urine 1.011 1.005 - 1.030   pH 5.0 5.0 - 8.0   Glucose, UA NEGATIVE NEGATIVE mg/dL   Hgb urine dipstick NEGATIVE NEGATIVE   Bilirubin Urine NEGATIVE NEGATIVE   Ketones, ur NEGATIVE NEGATIVE mg/dL   Protein, ur >=354 (A) NEGATIVE mg/dL   Nitrite NEGATIVE NEGATIVE   Leukocytes,Ua NEGATIVE NEGATIVE   RBC / HPF 0-5 0 - 5 RBC/hpf   WBC, UA 11-20 0 - 5 WBC/hpf   Bacteria, UA MANY (A) NONE SEEN   Squamous Epithelial / LPF 0-5 0 - 5   Mucus PRESENT    Hyaline Casts, UA PRESENT     Comment: Performed at [facility], [address], [place], [place] [address]  Protein / creatinine ratio, urine      Status: Abnormal   Collection Time: 05/01/20  8:17 AM  Result Value Ref Range   Creatinine, Urine 85.58 mg/dL   Total Protein, Urine 468 mg/dL    Comment: NO NORMAL RANGE ESTABLISHED FOR THIS TEST RESULTS CONFIRMED BY MANUAL DILUTION    Protein Creatinine Ratio 5.47 (H) 0.00 - 0.15 mg/mg[Cre]    Comment: Performed at [facility], [address], [place], [place] [address]  Glucose, capillary     Status: None   Collection Time: 05/01/20 11:28 AM  Result Value Ref Range   Glucose-Capillary 72 70 - 99 mg/dL    Comment: Glucose reference range applies only to samples taken after fasting for at least 8 hours.   Comment 1 Notify RN    Comment 2 Document in Chart    Review of Systems  Constitutional: Negative for fever.  Eyes: Negative for photophobia and visual disturbance.  Gastrointestinal: Negative for abdominal pain.  Genitourinary: Negative for vaginal bleeding and vaginal discharge.  Neurological: Negative for headaches.   Physical Exam   Blood pressure (!) 134/101, pulse 82, temperature 98.3 F (36.8 C), temperature source Oral, resp. rate 16, height 5' (1.524 m), weight 77.3 kg, SpO2 100 %.  Patient Vitals for the past 24 hrs:  BP Temp Temp src Pulse Resp SpO2 Height Weight  05/01/20 1330 (!) 153/102 -- -- 88 16 100 % -- --  05/01/20 1320 138/83 -- -- 77 -- -- -- --  05/01/20 1250 136/86 -- -- 76 -- -- -- --  05/01/20 1240 136/86 -- -- 77 -- -- -- --  05/01/20 1230 131/87 -- -- 72 -- -- -- --  05/01/20 1228 138/90 -- -- 77 -- -- -- --  05/01/20 1120 (!) 158/99 -- -- 75 -- -- -- --  05/01/20 1110 (!) 155/105 -- --  78 -- -- -- --  05/01/20 1100 (!) 154/97 -- -- 78 -- -- -- --  05/01/20 1050 (!) 141/90 -- -- 79 -- -- -- --  05/01/20 1040 (!) 141/93 -- -- 74 -- -- -- --  05/01/20 1030 (!) 139/97 -- -- 80 -- -- -- --  05/01/20 1020 (!) 140/91 -- -- 79 -- -- -- --  05/01/20 1010 (!) 144/97 -- -- 84 -- -- -- --  05/01/20 1000 (!) 143/97 -- -- 87 -- -- -- --  05/01/20  0952 (!) 140/96 -- -- 78 -- -- -- --  05/01/20 0927 (!) 154/101 -- -- 76 -- -- -- --  05/01/20 0840 (!) 150/101 -- -- 74 -- 98 % -- --  05/01/20 0830 (!) 134/101 -- -- 82 -- -- -- --  05/01/20 0820 113/85 -- -- 83 -- -- -- --  05/01/20 0816 (!) 133/97 -- -- 86 -- -- -- --  05/01/20 0803 (!) 157/114 -- -- 80 -- -- -- --  05/01/20 0748 (!) 164/111 98.3 F (36.8 C) Oral 74 16 100 % -- --  05/01/20 0741 -- -- -- -- -- -- 5' (1.524 m) 77.3 kg    Physical Exam Constitutional:      Appearance: Normal appearance. She is normal weight.  HENT:     Head: Normocephalic.  Cardiovascular:     Rate and Rhythm: Normal rate.  Pulmonary:     Effort: Pulmonary effort is normal.     Breath sounds: Normal breath sounds.  Abdominal:     Palpations: Abdomen is soft.  Musculoskeletal:        General: No swelling.     Cervical back: Normal range of motion.  Skin:    General: Skin is warm.  Neurological:     General: No focal deficit present.     Mental Status: She is alert and oriented to person, place, and time.     Deep Tendon Reflexes: Reflexes normal.     Comments: Negative clonus  Psychiatric:        Mood and Affect: Mood normal.    Fetal Tracing: Baseline: 135 bpm Variability: moderate  Accelerations: 10x10 Decelerations: variable decelerations.  Toco: quiet   MAU Course  Procedures  None  MDM  PIH labs done in MAU LR bolus MFM [place]: BPP 8/10 and limited US with dopplers.  Dr. Grace Bushy unavailable to read [place] report, discussed with Dr. Judeth Cornfield.  11:56 Variable decelerations on fetal monitor. Dr.Ervin in OR and aware 1300: Called OR for Dr. Alysia Penna to review fetal tracing. BMZ, Magnesium, and Covid swab collected.   Assessment and Plan   A:  1. Fetal heart rate decelerations affecting management of mother   2. Decreased fetal movement   3. [redacted] weeks gestation of pregnancy     P:  Prep for Cesarean section per Dr. Alysia Penna BMZ X 1 dose Mag started  Duane Lope,  NP 05/01/2020 4:26 PM

## 2020-05-01 NOTE — Transfer of Care (Signed)
Immediate Anesthesia Transfer of Care Note  Patient: Nancy Benjamin  Procedure(s) Performed: CESAREAN SECTION (N/A Abdomen)  Patient Location: PACU  Anesthesia Type:Spinal  Level of Consciousness: awake  Airway & Oxygen Therapy: Patient Spontanous Breathing  Post-op Assessment: Report given to RN  Post vital signs: Reviewed and stable  Last Vitals:  Vitals Value Taken Time  BP    Temp    Pulse    Resp    SpO2      Last Pain:  Vitals:   05/01/20 0748  TempSrc: Oral         Complications: No complications documented.

## 2020-05-01 NOTE — Anesthesia Procedure Notes (Signed)
Spinal  Patient location during procedure: OR Start time: 05/01/2020 3:15 PM End time: 05/01/2020 3:18 PM Staffing Performed: anesthesiologist  Anesthesiologist: Nolon Nations, MD Preanesthetic Checklist Completed: patient identified, IV checked, site marked, risks and benefits discussed, surgical consent, monitors and equipment checked, pre-op evaluation and timeout performed Spinal Block Patient position: sitting Prep: DuraPrep and site prepped and draped Patient monitoring: heart rate, continuous pulse ox and blood pressure Approach: midline Location: L3-4 Injection technique: single-shot Needle Needle type: Sprotte  Needle gauge: 24 G Needle length: 9 cm Additional Notes Expiration date of kit checked and confirmed. Patient tolerated procedure well, without complications.

## 2020-05-01 NOTE — Discharge Summary (Signed)
Postpartum Discharge Summary     Patient Name: Nancy Benjamin DOB: 17-Apr-1981 MRN: [id number]  Date of admission: 05/01/2020 Delivery date:05/01/2020  Delivering provider: Chancy Milroy  Date of discharge: 05/05/2020  Admitting diagnosis: IUP 32 2/7 weeks, CHTN with SISPEC, IUGR, GDM, NRFHT's Intrauterine pregnancy: [redacted]w[redacted]d    Secondary diagnosis:  Active Problems:   Fetal heart rate decelerations affecting management of mother   Decreased fetal movements in third trimester   Post-operative state  Additional problems: NA    Discharge diagnosis: Preterm Pregnancy Delivered, CHTN with superimposed preeclampsia and GDM A1                                              Post partum procedures:none Augmentation: N/A Complications: None  Hospital course: Sceduled C/S   39y.o. yo G1P0101 at 371w2das admitted to the hospital 05/01/2020 for scheduled cesarean section with the following indication:Non-Reassuring FHR.Delivery details are as follows:  Membrane Rupture Time/Date: 3:38 PM ,05/01/2020   Delivery Method:C-Section, Low Transverse  Details of operation can be found in separate operative note.  Patient had a postpartum course notable for Magnesium x 24 hours, ongoing BP control issues requiring 2 meds.  She is ambulating, tolerating a regular diet, passing flatus, and urinating well. Patient is discharged home in stable condition on  05/05/20        Newborn Data: Birth date:05/01/2020  Birth time:3:38 PM  Gender:Female  Living status:Living  Apgars:4 ,8  Weight:1090 g     Magnesium Sulfate received: Yes: Seizure prophylaxis BMZ received: Yes Rhophylac:No MMR:No T-DaP:unknwon Flu: No Transfusion:No  Physical exam  Vitals:   05/04/20 1934 05/05/20 0139 05/05/20 0156 05/05/20 0639  BP: (!) 149/97 (!) 160/96 139/90 (!) 145/91  Pulse: (!) 121 91 94 (!) 102  Resp: _0 Temp: 99.1 F (37.3 C) 98 F (36.7 C)  98.7 F (37.1 C)  TempSrc: Oral Oral  Oral  SpO2: 99% 99%  99%   Weight:      Height:       General: alert, cooperative and no distress Lochia: appropriate Uterine Fundus: firm Incision: Healing well with no significant drainage DVT Evaluation: No evidence of DVT seen on physical exam. Labs: Lab Results  Component Value Date   WBC 20.7 (H) 05/02/2020   HGB 11.8 (L) 05/02/2020   HCT 35.7 (L) 05/02/2020   MCV 95.5 05/02/2020   PLT 205 05/02/2020   CMP Latest Ref Rng & Units 05/01/2020  Glucose 70 - 99 mg/dL 84  BUN 6 - 20 mg/dL 9  Creatinine 0.44 - 1.00 mg/dL 0.66  Sodium 135 - 145 mmol/L 134(L)  Potassium 3.5 - 5.1 mmol/L 4.0  Chloride 98 - 111 mmol/L 100  CO2 22 - 32 mmol/L 21(L)  Calcium 8.9 - 10.3 mg/dL 9.1  Total Protein 6.5 - 8.1 g/dL 6.1(L)  Total Bilirubin 0.3 - 1.2 mg/dL 0.7  Alkaline Phos 38 - 126 U/L 124  AST 15 - 41 U/L 26  ALT 0 - 44 U/L 32   Edinburgh Score: Edinburgh Postnatal Depression Scale Screening Tool 05/03/2020  I have been able to laugh and see the funny side of things. 0  I have looked forward with enjoyment to things. 1  I have blamed myself unnecessarily when things went wrong. 1  I have been anxious or worried for no good  reason. 1  I have felt scared or panicky for no good reason. 1  Things have been getting on top of me. 2  I have been so unhappy that I have had difficulty sleeping. 2  I have felt sad or miserable. 1  I have been so unhappy that I have been crying. 1  The thought of harming myself has occurred to me. 0  Edinburgh Postnatal Depression Scale Total 10     After visit meds:  Allergies as of 05/05/2020      Reactions   Nickel Rash   Other Rash   Robitussin       Medication List    TAKE these medications   acetaminophen 500 MG tablet Commonly known as: TYLENOL Take 500-1,000 mg by mouth every 6 (six) hours as needed for mild pain or headache.   aspirin EC 81 MG tablet Take 81 mg by mouth daily. Swallow whole.   enalapril 10 MG tablet Commonly known as: VASOTEC Take 1 tablet (10  mg total) by mouth 2 (two) times daily.   famotidine 20 MG tablet Commonly known as: PEPCID Take 20 mg by mouth at bedtime.   lansoprazole 30 MG capsule Commonly known as: PREVACID Take 30 mg by mouth every morning.   metoprolol 200 MG 24 hr tablet Commonly known as: TOPROL-XL Take 200 mg by mouth 2 (two) times daily.   NIFEdipine 90 MG 24 hr tablet Commonly known as: ADALAT CC Take 90 mg by mouth in the morning and at bedtime. What changed: Another medication with the same name was added. Make sure you understand how and when to take each.   NIFEdipine 60 MG 24 hr tablet Commonly known as: ADALAT CC Take 1 tablet (60 mg total) by mouth daily. What changed: You were already taking a medication with the same name, and this prescription was added. Make sure you understand how and when to take each.   oxyCODONE-acetaminophen 5-325 MG tablet Commonly known as: PERCOCET/ROXICET Take 1-2 tablets by mouth every 6 (six) hours as needed.   PRENATAL PO Take 1 tablet by mouth daily.   venlafaxine XR 150 MG 24 hr capsule Commonly known as: EFFEXOR-XR Take 150 mg by mouth daily with breakfast.        Discharge home in stable condition Infant Feeding: Bottle Infant Disposition:NICU Discharge instruction: per After Visit Summary and Postpartum booklet. Activity: Advance as tolerated. Pelvic rest for 6 weeks.  Diet: routine diet Future Appointments:No future appointments. Follow up Visit:   Please schedule this patient for a In person postpartum visit in 1 week with the following provider: MD. Additional Postpartum F/U:Incision check 1 week and BP check 1 week  High risk pregnancy complicated by: CHTN with SISPEC and IUGR Delivery mode:  C-Section, Low Transverse  Anticipated Birth Control:  Unsure   05/05/2020 Tanya S Pratt, MD   

## 2020-05-01 NOTE — H&P (Signed)
Nancy Benjamin is a 39 y.o. female G1P0 IUP 32 2/7 weeks presenting for decreased fetal movement. Pt with known CHTN, H/O SISPEC during this pregnancy but left AMA, IUGR with abnormal doppler studies of AEDF and GDM, diet control.  She received BMZ at 24 weeks. Prenatal care in [place]  She denies HA or visual changes, vaginal bleeding or LOF.  U/S today BPP 8/8, AFI 10, AEDF, growth < 1 %, vtx.  OB History    Gravida  1   Para      Term      Preterm      AB      Living        SAB      TAB      Ectopic      Multiple      Live Births             Past Medical History:  Diagnosis Date  . Diabetes mellitus without complication (HCC)    gestational  . Hypertension    patient reports htn since "mid 20's"    Family History: family history is not on file. Social History:  has no history on file for tobacco use, alcohol use, and drug use.     Maternal Diabetes: Yes:  Diabetes Type:  Diet controlled Genetic Screening: Normal Maternal Ultrasounds/Referrals: IUGR Fetal Ultrasounds or other Referrals:  Referred to Materal Fetal Medicine  Maternal Substance Abuse:  No Significant Maternal Medications:  None Significant Maternal Lab Results:  None Other Comments:  None  Review of Systems  Constitutional: Negative.   Respiratory: Negative.   Cardiovascular: Negative.   Gastrointestinal: Negative.    History   Blood pressure (!) 153/102, pulse 88, temperature 98.3 F (36.8 C), temperature source Oral, resp. rate 16, height 5' (1.524 m), weight 77.3 kg, SpO2 100 %. Exam Physical Exam Constitutional:      Appearance: Normal appearance.  Cardiovascular:     Rate and Rhythm: Normal rate and regular rhythm.  Pulmonary:     Effort: Pulmonary effort is normal.     Breath sounds: Normal breath sounds.  Abdominal:     General: Bowel sounds are normal.     Palpations: Abdomen is soft.  Musculoskeletal:     Cervical back: Normal range of motion and neck  supple.  Neurological:     Mental Status: She is alert.     Prenatal labs: ABO, Rh:   Antibody:   Rubella:   RPR:    HBsAg:    HIV:    GBS:     Assessment/Plan: IUP 32 2/7 weeks CHTN with SISPEC IUGR AEDF Gestational DM, diet control.  Discussed with pt. Pt having variable decelerations. Enlight of aforementioned dx, will proceed toward delivery. Will start Magnesium, BMZ given, COVID test in process. Delivery via c section.  The risks of cesarean section discussed with the patient included but were not limited to: bleeding which may require transfusion or reoperation; infection which may require antibiotics; injury to bowel, bladder, ureters or other surrounding organs; injury to the fetus; need for additional procedures including hysterectomy in the event of a life-threatening hemorrhage; placental abnormalities wth subsequent pregnancies, incisional problems, thromboembolic phenomenon and other postoperative/anesthesia complications. The patient concurred with the proposed plan, giving informed written consent for the procedure.   Anesthesia and OR aware. Preoperative prophylactic antibiotics and SCDs ordered on call to the OR.  To OR when ready.  Jakota Manthei L. Alysia Penna, MD       [name]  [name] 05/01/2020, 1:41 PM

## 2020-05-02 ENCOUNTER — Encounter (HOSPITAL_COMMUNITY): Payer: Self-pay | Admitting: Obstetrics and Gynecology

## 2020-05-02 LAB — RPR: RPR Ser Ql: NONREACTIVE

## 2020-05-02 LAB — CBC
HCT: 35.7 % — ABNORMAL LOW (ref 36.0–46.0)
Hemoglobin: 11.8 g/dL — ABNORMAL LOW (ref 12.0–15.0)
MCH: 31.6 pg (ref 26.0–34.0)
MCHC: 33.1 g/dL (ref 30.0–36.0)
MCV: 95.5 fL (ref 80.0–100.0)
Platelets: 205 10*3/uL (ref 150–400)
RBC: 3.74 MIL/uL — ABNORMAL LOW (ref 3.87–5.11)
RDW: 11.8 % (ref 11.5–15.5)
WBC: 20.7 10*3/uL — ABNORMAL HIGH (ref 4.0–10.5)
nRBC: 0 % (ref 0.0–0.2)

## 2020-05-02 MED ORDER — LABETALOL HCL 5 MG/ML IV SOLN
20.0000 mg | INTRAVENOUS | Status: DC | PRN
  Administered 2020-05-03 – 2020-05-04 (×3): 20 mg via INTRAVENOUS
  Filled 2020-05-02 (×3): qty 4

## 2020-05-02 MED ORDER — LABETALOL HCL 5 MG/ML IV SOLN: 40.0000 mg | INTRAVENOUS | Status: DC | PRN

## 2020-05-02 MED ORDER — ENALAPRIL MALEATE 5 MG PO TABS
5.0000 mg | ORAL_TABLET | Freq: Once | ORAL | Status: AC
  Administered 2020-05-02: 5 mg via ORAL
  Filled 2020-05-02: qty 1

## 2020-05-02 MED ORDER — ENALAPRIL MALEATE 5 MG PO TABS
10.0000 mg | ORAL_TABLET | Freq: Every day | ORAL | Status: DC
  Administered 2020-05-03: 10 mg via ORAL
  Filled 2020-05-02: qty 2

## 2020-05-02 MED ORDER — PANTOPRAZOLE SODIUM 40 MG PO TBEC
40.0000 mg | DELAYED_RELEASE_TABLET | Freq: Every morning | ORAL | Status: DC
  Administered 2020-05-02 – 2020-05-05 (×4): 40 mg via ORAL
  Filled 2020-05-02 (×4): qty 1

## 2020-05-02 MED ORDER — HYDRALAZINE HCL 20 MG/ML IJ SOLN
10.0000 mg | INTRAMUSCULAR | Status: DC | PRN
  Administered 2020-05-02: 10 mg via INTRAVENOUS
  Filled 2020-05-02: qty 1

## 2020-05-02 MED ORDER — LABETALOL HCL 5 MG/ML IV SOLN: 80.0000 mg | INTRAVENOUS | Status: DC | PRN

## 2020-05-02 NOTE — Progress Notes (Signed)
Subjective: Postpartum Day 1: Cesarean Delivery d/t CHTN with SISPEC and IUGR  Pt reports feeling sore. Denies HA or visual changes. Pain controlled. Tolerating diet    Objective: Vital signs in last 24 hours: Temp:  [96.3 F (35.7 C)-97.8 F (36.6 C)] 97.2 F (36.2 C) (09/05 0400) Pulse Rate:  [71-99] 84 (09/05 0529) Resp:  [12-19] 16 (09/05 0615) BP: (67-165)/(38-114) 156/101 (09/05 0529) SpO2:  [93 %-100 %] 99 % (09/05 0525)  Physical Exam:  General: alert Lochia: appropriate Uterine Fundus: firm Incision: healing well DVT Evaluation: No evidence of DVT seen on physical exam.  Recent Labs    05/01/20 0812  HGB 14.7  HCT 43.3    Assessment/Plan: Status post Cesarean section. Doing well postoperatively. Magnesium x 24 hrs. Vasotec for BP. Continue current care.  Hermina Staggers 05/02/2020, 7:48 AM

## 2020-05-02 NOTE — Progress Notes (Signed)
Patient was taught hand expression, hand massage, and how to use hand pump. She does not want to breast feed but wants to express a little colostrum. Patient does not want to use a DEBP. She will wear a good fitting bra, and eventually use cabbage leaves to dry up any supply. She was taught to look for signs of engorgement. Patient satisfied with POC.

## 2020-05-02 NOTE — Lactation Note (Signed)
This note was copied from a baby's chart. Lactation Consultation Note  Patient Name: Nancy Benjamin [id number] Date: 05/02/2020   RN Irving Burton reported to [facility] that mom has changed her mind and decided to to formula instead. Feeding choice option on admission was blank by the time that LC came to 1st floor. RN Irving Burton also mentioned that she tried to teach some hand expression and brought some colostrum bullets but mom didn't seem interested.  LC services no longer needed, RN will contact lactation again in case mom changes her mind, baby is in NICU due to prematurity born at 31 2/7.   Maternal Data    Feeding    LATCH Score                   Interventions    Lactation Tools Discussed/Used     Consult Status      Mylik Pro Venetia Constable 05/02/2020, 9:02 AM

## 2020-05-03 DIAGNOSIS — R030 Elevated blood-pressure reading, without diagnosis of hypertension: Secondary | ICD-10-CM

## 2020-05-03 DIAGNOSIS — Z87891 Personal history of nicotine dependence: Secondary | ICD-10-CM

## 2020-05-03 DIAGNOSIS — O99893 Other specified diseases and conditions complicating puerperium: Secondary | ICD-10-CM

## 2020-05-03 MED ORDER — NIFEDIPINE ER OSMOTIC RELEASE 30 MG PO TB24
30.0000 mg | ORAL_TABLET | Freq: Every day | ORAL | Status: DC
  Administered 2020-05-03: 30 mg via ORAL
  Filled 2020-05-03: qty 1

## 2020-05-03 MED ORDER — DOCUSATE SODIUM 100 MG PO CAPS
100.0000 mg | ORAL_CAPSULE | Freq: Every day | ORAL | Status: DC
  Administered 2020-05-03 – 2020-05-05 (×3): 100 mg via ORAL
  Filled 2020-05-03 (×3): qty 1

## 2020-05-03 NOTE — Clinical Social Work Maternal (Signed)
CLINICAL SOCIAL WORK MATERNAL/CHILD NOTE  Patient Details  Name: Nancy Benjamin MRN: [id number] Date of Birth: 1981-04-22  Date:  05/03/2020  Clinical Social Worker Initiating Note:  Blaine Hamper Date/Time: Initiated:  05/03/20/1130     Child's Name:  Nancy Benjamin   Biological Parents:  Mother, Father   Need for Interpreter:  None   Reason for Referral:  Behavioral Health Concerns (Hx of depression)   Address:  [address] [place] [address]    Phone number:  [phone] (home)     Additional phone number: FOB's number is   Household Members/Support Persons (HM/SP):   Household Member/Support Person 1   HM/SP Name Relationship DOB or Age  HM/SP -1 Shany Marinez FOB/Husband 11/05/1982  HM/SP -2        HM/SP -3        HM/SP -4        HM/SP -5        HM/SP -6        HM/SP -7        HM/SP -8          Natural Supports (not living in the home):  Extended Family, Immediate Family, Parent (Per [name], FOB's family will also provide supports.)   Professional Supports: None   Employment: Full-time   Type of Work: Pensions consultant   Education:  Research scientist (physical sciences)   Homebound arranged:    Surveyor, quantity Resources:  Media planner   Other Resources:      Cultural/Religious Considerations Which May Impact Care:  None Reported.  Strengths:  Ability to meet basic needs , Home prepared for child , Compliance with medical plan , Understanding of illness, Psychotropic Medications   Psychotropic Medications:  Effexor      Pediatrician:       Pediatrician List:   [facility]      Pediatrician Fax Number:    Risk Factors/Current Problems:  Mental Health Concerns    Cognitive State:  Goal Oriented , Insightful , Linear Thinking    Mood/Affect:  Comfortable , Interested , Tearful , Relaxed    CSW Assessment:  CSW meet with [name] in room 115 to complete  an assessment for mental health hx and NICU admission  When CSW arrived, [name] was in the bed resting and watching TV.  [name] was polite, easy to engage, and receptive to meeting with CSW.   CSW asked about [name]'s birthing experience and [name] openly shared her story.  [name] reported that She opted to have her baby "Here," because she did not feel comfortable with the providers in [place]. [name] became tearful as she shared her pregnancy admission stories and her fear of losing her baby.  CSW validated and normalized [name]'s thoughts and feelings and shared other emotions that [name] may experience during the postpartum period. [name] also made CSW aware that she completed her [place] assessment and scored a 10. CSW inquired about [name]'s MH and [name] acknowledged a hx of anxiety/depression and reported that she was dx about 6 years ago. [name] that she is currently prescribed Effexor and symptoms are managed. CSW educated [name] about PMADs. CSW informed [name] of possible supports and interventions to decrease PPD.  CSW also encouraged [name] to seek medical attention if needed for increased signs and symptoms of PPD.  CSW encouraged [name] to evaluate her mental health throughout the postpartum period  with the use of the New Mom Checklist developed by [facility]partum [facility] and notify a medical professional if symptoms arise; [name] agreed. [name] presented with insight and awareness and denied SI, HI, and DV when assessed for safety. [name] reported having a good support team that will be willing to help if needed. CSW reviewed safe sleep and SIDS. [name] and was knowledgeable and asked appropriate questions. [name] communicated that [name] has everything she needs for the baby and is prepared to meet her infants needs.  [name] did not have any further questions, concerns, or needs currently. CSW thanked [name] for allowing CSW to meet with her. CSW will continue to offer resources and supports to family while infant remains in NICU.   CSW Plan/Description:  Psychosocial  Support and Ongoing Assessment of Needs, Sudden Infant Death Syndrome (SIDS) Education, Perinatal Mood and Anxiety Disorder (PMADs) Education, Other Patient/Family Education, Other Information/Referral to [employer], MSW, [employer] Clinical Social Work [phone]  Barbara Cower, LCSW 05/03/2020, 11:36 AM

## 2020-05-03 NOTE — Progress Notes (Signed)
POSTPARTUM PROGRESS NOTE  POD #2  Subjective:  Nancy Benjamin is a 39 y.o. G1P0101 s/p primary LTCS at [redacted]w[redacted]d.  She reports she doing well. No acute events overnight.  She denies any problems with ambulating, voiding or po intake. Denies nausea or vomiting. She has  passed flatus. Pain is well controlled.  Lochia is normal.  Objective: Blood pressure (!) 154/90, pulse 83, temperature 98.9 F (37.2 C), temperature source Oral, resp. rate 18, height 5' (1.524 m), weight 77.3 kg, SpO2 98 %, unknown if currently breastfeeding.  Physical Exam:  General: alert, cooperative and no distress Chest: no respiratory distress Heart:regular rate, distal pulses intact Abdomen: soft, nontender,  Uterine Fundus: firm, appropriately tender DVT Evaluation: No calf swelling or tenderness Extremities: minimal edema Skin: warm, dry; incision clean/dry/intact w/ honeycomb dressing in place  Recent Labs    05/01/20 0812 05/02/20 0744  HGB 14.7 11.8*  HCT 43.3 35.7*    Assessment/Plan: Nancy Benjamin is a 39 y.o. G1P0101 s/p primary LTCS at [redacted]w[redacted]d for IUGR, oligohydramnios and NRFHT.  POD#2 - Doing welll; pain well controlled. H/H appropriate  Routine postpartum care  OOB, ambulated  Pt continues with hypertension.  She was started on vasotec 10 mg.                   BP remains mildly elevate especially SBP.  Procardia XL added to             antihypertensives.  Dispo: evaluate for possible d/c on 9/7 if BP within acceptable parameters..   LOS: 2 days   Mariel Aloe, MD Faculty attending, Center for [facility]. 05/03/2020, 3:10 PM

## 2020-05-04 MED ORDER — NIFEDIPINE ER OSMOTIC RELEASE 30 MG PO TB24
60.0000 mg | ORAL_TABLET | Freq: Every day | ORAL | Status: DC
  Administered 2020-05-04 – 2020-05-05 (×2): 60 mg via ORAL
  Filled 2020-05-04 (×2): qty 2

## 2020-05-04 MED ORDER — ENALAPRIL MALEATE 5 MG PO TABS
10.0000 mg | ORAL_TABLET | Freq: Two times a day (BID) | ORAL | Status: DC
  Administered 2020-05-04 – 2020-05-05 (×3): 10 mg via ORAL
  Filled 2020-05-04 (×3): qty 2

## 2020-05-04 NOTE — Progress Notes (Signed)
POSTPARTUM PROGRESS NOTE  POD #3  Subjective:  Nancy Benjamin is a 39 y.o. G1P0101 s/p primary LTCS at [redacted]w[redacted]d.  She reports she doing well. No acute events overnight. She denies any problems with ambulating, voiding or po intake. Denies nausea or vomiting. She has  passed flatus. Pain is well controlled.  Lochia is normal.  Objective: Blood pressure (!) 153/89, pulse 81, temperature 98 F (36.7 C), resp. rate 18, height 5' (1.524 m), weight 77.3 kg, SpO2 100 %, unknown if currently breastfeeding.  Physical Exam:  General: alert, cooperative and no distress Chest: no respiratory distress Heart:regular rate, distal pulses intact Abdomen: soft, nontender,  Uterine Fundus: firm, appropriately tender DVT Evaluation: No calf swelling or tenderness Extremities: minimal edema Skin: warm, dry; incision clean/dry/intact w/ honeycomb dressing in place  Recent Labs    05/01/20 0812 05/02/20 0744  HGB 14.7 11.8*  HCT 43.3 35.7*    Assessment/Plan: Nancy Benjamin is a 39 y.o. G1P0101 s/p primary LTCS at [redacted]w[redacted]d for IUGR, oligohydramnios and NRFHT.  POD#3 - Doing well; pain well controlled.                  Blood pressure remains elevated                 Increase procardia to 60 mg daily and vasotec to 10 mg po BID Dispo: Pt may need another day of inpatient stay for BP control before discharge  LOS: 3 days   Mariel Aloe, MD Faculty Attending, Center for [facility] 05/04/2020, 7:34 AM

## 2020-05-05 LAB — SURGICAL PATHOLOGY

## 2020-05-05 MED ORDER — ENALAPRIL MALEATE 10 MG PO TABS
10.0000 mg | ORAL_TABLET | Freq: Two times a day (BID) | ORAL | 1 refills | Status: DC
Start: 2020-05-05 — End: 2020-06-21

## 2020-05-05 MED ORDER — NIFEDIPINE ER 60 MG PO TB24
60.0000 mg | ORAL_TABLET | Freq: Every day | ORAL | 1 refills | Status: DC
Start: 2020-05-05 — End: 2020-06-21

## 2020-05-05 MED ORDER — OXYCODONE-ACETAMINOPHEN 5-325 MG PO TABS: 1.0000 | ORAL_TABLET | Freq: Four times a day (QID) | ORAL | 0 refills | Status: DC | PRN

## 2020-05-05 MED FILL — ENALAPRIL MALEATE 10 MG TAB: 10 | 30 days supply | Qty: 60 | Fill #0

## 2020-05-05 MED FILL — NIFEdipine ER 60 MG TB24: 60 | 30 days supply | Qty: 30 | Fill #0

## 2020-05-05 MED FILL — OXYCODONE-APAP 5-325MG: 5-325 | 5 days supply | Qty: 20 | Fill #0

## 2020-05-05 NOTE — Progress Notes (Signed)
Discharge instructions and prescriptions given to pt. Discussed post c-section care, signs and symptoms to report to the MD, upcoming appointments, and meds. Pt verbalizes understanding and has no questions or concerns at this time. Pt discharged from hospital in stable condition. 

## 2020-05-05 NOTE — Discharge Instructions (Signed)
Postpartum Care After Cesarean Delivery This sheet gives you information about how to care for yourself from the time you deliver your baby to up to 6-12 weeks after delivery (postpartum period). Your health care provider may also give you more specific instructions. If you have problems or questions, contact your health care provider. Follow these instructions at home: Medicines  Take over-the-counter and prescription medicines only as told by your health care provider.  If you were prescribed an antibiotic medicine, take it as told by your health care provider. Do not stop taking the antibiotic even if you start to feel better.  Ask your health care provider if the medicine prescribed to you: ? Requires you to avoid driving or using heavy machinery. ? Can cause constipation. You may need to take actions to prevent or treat constipation, such as:  Drink enough fluid to keep your urine pale yellow.  Take over-the-counter or prescription medicines.  Eat foods that are high in fiber, such as beans, whole grains, and fresh fruits and vegetables.  Limit foods that are high in fat and processed sugars, such as fried or sweet foods. Activity  Gradually return to your normal activities as told by your health care provider.  Avoid activities that take a lot of effort and energy (are strenuous) until approved by your health care provider. Walking at a slow to moderate pace is usually safe. Ask your health care provider what activities are safe for you. ? Do not lift anything that is heavier than your baby or 10 lb (4.5 kg) as told by your health care provider. ? Do not vacuum, climb stairs, or drive a car for as long as told by your health care provider.  If possible, have someone help you at home until you are able to do your usual activities yourself.  Rest as much as possible. Try to rest or take naps while your baby is sleeping. Vaginal bleeding  It is normal to have vaginal bleeding  (lochia) after delivery. Wear a sanitary pad to absorb vaginal bleeding and discharge. ? During the first week after delivery, the amount and appearance of lochia is often similar to a menstrual period. ? Over the next few weeks, it will gradually decrease to a dry, yellow-brown discharge. ? For most women, lochia stops completely by 4-6 weeks after delivery. Vaginal bleeding can vary from woman to woman.  Change your sanitary pads frequently. Watch for any changes in your flow, such as: ? A sudden increase in volume. ? A change in color. ? Large blood clots.  If you pass a blood clot, save it and call your health care provider to discuss. Do not flush blood clots down the toilet before you get instructions from your health care provider.  Do not use tampons or douches until your health care provider says this is safe.  If you are not breastfeeding, your period should return 6-8 weeks after delivery. If you are breastfeeding, your period may return anytime between 8 weeks after delivery and the time that you stop breastfeeding. Perineal care   If your C-section (Cesarean section) was unplanned, and you were allowed to labor and push before delivery, you may have pain, swelling, and discomfort of the tissue between your vaginal opening and your anus (perineum). You may also have an incision in the tissue (episiotomy) or the tissue may have torn during delivery. Follow these instructions as told by your health care provider: ? Keep your perineum clean and dry as told by   your health care provider. Use medicated pads and pain-relieving sprays and creams as directed. ? If you have an episiotomy or vaginal tear, check the area every day for signs of infection. Check for:  Redness, swelling, or pain.  Fluid or blood.  Warmth.  Pus or a bad smell. ? You may be given a squirt bottle to use instead of wiping to clean the perineum area after you go to the bathroom. As you start healing, you may use  the squirt bottle before wiping yourself. Make sure to wipe gently. ? To relieve pain caused by an episiotomy, vaginal tear, or hemorrhoids, try taking a warm sitz bath 2-3 times a day. A sitz bath is a warm water bath that is taken while you are sitting down. The water should only come up to your hips and should cover your buttocks. Breast care  Within the first few days after delivery, your breasts may feel heavy, full, and uncomfortable (breast engorgement). You may also have milk leaking from your breasts. Your health care provider can suggest ways to help relieve breast discomfort. Breast engorgement should go away within a few days.  If you are breastfeeding: ? Wear a bra that supports your breasts and fits you well. ? Keep your nipples clean and dry. Apply creams and ointments as told by your health care provider. ? You may need to use breast pads to absorb milk leakage. ? You may have uterine contractions every time you breastfeed for several weeks after delivery. Uterine contractions help your uterus return to its normal size. ? If you have any problems with breastfeeding, work with your health care provider or a lactation consultant.  If you are not breastfeeding: ? Avoid touching your breasts as this can make your breasts produce more milk. ? Wear a well-fitting bra and use cold packs to help with swelling. ? Do not squeeze out (express) milk. This causes you to make more milk. Intimacy and sexuality  Ask your health care provider when you can engage in sexual activity. This may depend on your: ? Risk of infection. ? Healing rate. ? Comfort and desire to engage in sexual activity.  You are able to get pregnant after delivery, even if you have not had your period. If desired, talk with your health care provider about methods of family planning or birth control (contraception). Lifestyle  Do not use any products that contain nicotine or tobacco, such as cigarettes, e-cigarettes,  and chewing tobacco. If you need help quitting, ask your health care provider.  Do not drink alcohol, especially if you are breastfeeding. Eating and drinking   Drink enough fluid to keep your urine pale yellow.  Eat high-fiber foods every day. These may help prevent or relieve constipation. High-fiber foods include: ? Whole grain cereals and breads. ? Brown rice. ? Beans. ? Fresh fruits and vegetables.  Take your prenatal vitamins until your postpartum checkup or until your health care provider tells you it is okay to stop. General instructions  Keep all follow-up visits for you and your baby as told by your health care provider. Most women visit their health care provider for a postpartum checkup within the first 3-6 weeks after delivery. Contact a health care provider if you:  Feel unable to cope with the changes that a new baby brings to your life, and these feelings do not go away.  Feel unusually sad or worried.  Have breasts that are painful, hard, or turn red.  Have a fever.    Have trouble holding urine or keeping urine from leaking.  Have little or no interest in activities you used to enjoy.  Have not breastfed at all and you have not had a menstrual period for 12 weeks after delivery.  Have stopped breastfeeding and you have not had a menstrual period for 12 weeks after you stopped breastfeeding.  Have questions about caring for yourself or your baby.  Pass a blood clot from your vagina. Get help right away if you:  Have chest pain.  Have difficulty breathing.  Have sudden, severe leg pain.  Have severe pain or cramping in your abdomen.  Bleed from your vagina so much that you fill more than one sanitary pad in one hour. Bleeding should not be heavier than your heaviest period.  Develop a severe headache.  Faint.  Have blurred vision or spots in your vision.  Have a bad-smelling vaginal discharge.  Have thoughts about hurting yourself or your  baby. If you ever feel like you may hurt yourself or others, or have thoughts about taking your own life, get help right away. You can go to your nearest emergency department or call:  Your local emergency services (911 in the [place]  A suicide crisis helpline, such as the National Suicide Prevention Lifeline at 1-800-273-8255. This is open 24 hours a day. Summary  The period of time from when you deliver your baby to up to 6-12 weeks after delivery is called the postpartum period.  Gradually return to your normal activities as told by your health care provider.  Keep all follow-up visits for you and your baby as told by your health care provider. This information is not intended to replace advice given to you by your health care provider. Make sure you discuss any questions you have with your health care provider. Document Revised: 04/03/2018 Document Reviewed: 04/03/2018 Elsevier Patient Education  2020 Elsevier Inc[employer]    Postpartum Hypertension Postpartum hypertension is high blood pressure that remains higher than normal after childbirth. You may not realize that you have postpartum hypertension if your blood pressure is not being checked regularly. In most cases, postpartum hypertension will go away on its own, usually within a week of delivery. However, for some women, medical treatment is required to prevent serious complications, such as seizures or stroke. What are the causes? This condition may be caused by one or more of the following:  Hypertension that existed before pregnancy (chronic hypertension).  Hypertension that comes on as a result of pregnancy (gestational hypertension).  Hypertensive disorders during pregnancy (preeclampsia) or seizures in women who have high blood pressure during pregnancy (eclampsia).  A condition in which the liver, platelets, and red blood cells are damaged during pregnancy (HELLP syndrome).  A condition in which the thyroid produces too much  hormones (hyperthyroidism).  Other rare problems of the nerves (neurological disorders) or blood disorders. In some cases, the cause may not be known. What increases the risk? The following factors may make you more likely to develop this condition:  Chronic hypertension. In some cases, this may not have been diagnosed before pregnancy.  Obesity.  Type 2 diabetes.  Kidney disease.  History of preeclampsia or eclampsia.  Other medical conditions that change the level of hormones in the body (hormonal imbalance). What are the signs or symptoms? As with all types of hypertension, postpartum hypertension may not have any symptoms. Depending on how high your blood pressure is, you may experience:  Headaches. These may be mild, moderate, or severe.   They may also be steady, constant, or sudden in onset (thunderclap headache).  Changes in your ability to see (visual changes).  Dizziness.  Shortness of breath.  Swelling of your hands, feet, lower legs, or face. In some cases, you may have swelling in more than one of these locations.  Heart palpitations or a racing heartbeat.  Difficulty breathing while lying down.  Decrease in the amount of urine that you pass. Other rare signs and symptoms may include:  Sweating more than usual. This lasts longer than a few days after delivery.  Chest pain.  Sudden dizziness when you get up from sitting or lying down.  Seizures.  Nausea or vomiting.  Abdominal pain. How is this diagnosed? This condition may be diagnosed based on the results of a physical exam, blood pressure measurements, and blood and urine tests. You may also have other tests, such as a CT scan or an MRI, to check for other problems of postpartum hypertension. How is this treated? If blood pressure is high enough to require treatment, your options may include:  Medicines to reduce blood pressure (antihypertensives). Tell your health care provider if you are  breastfeeding or if you plan to breastfeed. There are many antihypertensive medicines that are safe to take while breastfeeding.  Stopping medicines that may be causing hypertension.  Treating medical conditions that are causing hypertension.  Treating the complications of hypertension, such as seizures, stroke, or kidney problems. Your health care provider will also continue to monitor your blood pressure closely until it is within a safe range for you. Follow these instructions at home:  Take over-the-counter and prescription medicines only as told by your health care provider.  Return to your normal activities as told by your health care provider. Ask your health care provider what activities are safe for you.  Do not use any products that contain nicotine or tobacco, such as cigarettes and e-cigarettes. If you need help quitting, ask your health care provider.  Keep all follow-up visits as told by your health care provider. This is important. Contact a health care provider if:  Your symptoms get worse.  You have new symptoms, such as: ? A headache that does not get better. ? Dizziness. ? Visual changes. Get help right away if:  You suddenly develop swelling in your hands, ankles, or face.  You have sudden, rapid weight gain.  You develop difficulty breathing, chest pain, racing heartbeat, or heart palpitations.  You develop severe pain in your abdomen.  You have any symptoms of a stroke. "BE FAST" is an easy way to remember the main warning signs of a stroke: ? B - Balance. Signs are dizziness, sudden trouble walking, or loss of balance. ? E - Eyes. Signs are trouble seeing or a sudden change in vision. ? F - Face. Signs are sudden weakness or numbness of the face, or the face or eyelid drooping on one side. ? A - Arms. Signs are weakness or numbness in an arm. This happens suddenly and usually on one side of the body. ? S - Speech. Signs are sudden trouble speaking,  slurred speech, or trouble understanding what people say. ? T - Time. Time to call emergency services. Write down what time symptoms started.  You have other signs of a stroke, such as: ? A sudden, severe headache with no known cause. ? Nausea or vomiting. ? Seizure. These symptoms may represent a serious problem that is an emergency. Do not wait to see if the symptoms   will go away. Get medical help right away. Call your local emergency services (911 in the [place] Do not drive yourself to the hospital. Summary  Postpartum hypertension is high blood pressure that remains higher than normal after childbirth.  In most cases, postpartum hypertension will go away on its own, usually within a week of delivery.  For some women, medical treatment is required to prevent serious complications, such as seizures or stroke. This information is not intended to replace advice given to you by your health care provider. Make sure you discuss any questions you have with your health care provider. Document Revised: 09/20/2018 Document Reviewed: 06/04/2017 Elsevier Patient Education  2020 Elsevier Inc.  

## 2020-05-06 ENCOUNTER — Encounter (HOSPITAL_COMMUNITY): Payer: Self-pay | Admitting: Obstetrics and Gynecology

## 2020-05-12 ENCOUNTER — Encounter: Payer: PRIVATE HEALTH INSURANCE | Admitting: Advanced Practice Midwife

## 2020-05-14 ENCOUNTER — Ambulatory Visit (INDEPENDENT_AMBULATORY_CARE_PROVIDER_SITE_OTHER): Payer: PRIVATE HEALTH INSURANCE | Admitting: Obstetrics & Gynecology

## 2020-05-14 ENCOUNTER — Encounter: Payer: Self-pay | Admitting: Obstetrics & Gynecology

## 2020-05-14 VITALS — BP 137/96 | HR 119 | Ht 60.0 in | Wt 160.0 lb

## 2020-05-14 DIAGNOSIS — Z98891 History of uterine scar from previous surgery: Secondary | ICD-10-CM

## 2020-05-14 NOTE — Progress Notes (Signed)
  HPI: Patient returns for routine postoperative follow-up having undergone primary c section  on 05/01/20.  The patient's immediate postoperative recovery has been unremarkable. Since hospital discharge the patient reports no problems.   Current Outpatient Medications: aspirin EC 81 MG tablet, Take 81 mg by mouth daily. Swallow whole., Disp: , Rfl:  enalapril (VASOTEC) 10 MG tablet, Take 1 tablet (10 mg total) by mouth 2 (two) times daily., Disp: 30 tablet, Rfl: 1 famotidine (PEPCID) 20 MG tablet, Take 20 mg by mouth at bedtime., Disp: , Rfl:  lansoprazole (PREVACID) 30 MG capsule, Take 30 mg by mouth every morning., Disp: , Rfl:  NIFEdipine (ADALAT CC) 60 MG 24 hr tablet, Take 1 tablet (60 mg total) by mouth daily., Disp: 60 tablet, Rfl: 1 venlafaxine XR (EFFEXOR-XR) 150 MG 24 hr capsule, Take 150 mg by mouth daily with breakfast., Disp: , Rfl:   No current facility-administered medications for this visit.    Blood pressure (!) 137/96, pulse (!) 119, height 5' (1.524 m), weight 160 lb (72.6 kg), not currently breastfeeding.  Physical Exam: Incision clean dry intact Abdomen soft nontender benign  Diagnostic Tests:   Pathology: n/a  Impression: S/p primary section for NRFHRT/FGR CHTN Plan: contineu vasotec 10 mg BID  Follow up: 5  weeks  Lazaro Arms, MD

## 2020-06-21 ENCOUNTER — Ambulatory Visit (INDEPENDENT_AMBULATORY_CARE_PROVIDER_SITE_OTHER): Payer: PRIVATE HEALTH INSURANCE | Admitting: Obstetrics & Gynecology

## 2020-06-21 ENCOUNTER — Other Ambulatory Visit: Payer: Self-pay

## 2020-06-21 ENCOUNTER — Encounter: Payer: Self-pay | Admitting: Obstetrics & Gynecology

## 2020-06-21 VITALS — BP 142/95 | HR 102 | Ht 60.0 in | Wt 160.5 lb

## 2020-06-21 DIAGNOSIS — Z3202 Encounter for pregnancy test, result negative: Secondary | ICD-10-CM

## 2020-06-21 LAB — POCT URINE PREGNANCY: Preg Test, Ur: NEGATIVE

## 2020-06-21 MED ORDER — NIFEDIPINE ER 60 MG PO TB24
60.0000 mg | ORAL_TABLET | Freq: Every day | ORAL | 11 refills | Status: DC
Start: 2020-06-21 — End: 2020-08-17

## 2020-06-21 MED ORDER — ENALAPRIL MALEATE 10 MG PO TABS
10.0000 mg | ORAL_TABLET | Freq: Two times a day (BID) | ORAL | 11 refills | Status: DC
Start: 2020-06-21 — End: 2020-08-17

## 2020-06-21 MED ORDER — NORETHINDRONE 0.35 MG PO TABS: 1.0000 | ORAL_TABLET | Freq: Every day | ORAL | 11 refills | Status: DC

## 2020-06-21 NOTE — Progress Notes (Signed)
Subjective:     Nancy Benjamin is a 39 y.o. female who presents for a postpartum visit. She is 6 weeks postpartum following a low cervical transverse Cesarean section. I have fully reviewed the prenatal and intrapartum course. The delivery was at 32 gestational weeks. Outcome: primary cesarean section, low transverse incision. Anesthesia: epidural. Postpartum course has been remarkable for BP management. Baby's course has been NICU. Baby is feeding by bottle - Similac Advance. Bleeding no bleeding. Bowel function is normal. Bladder function is normal. Patient is not sexually active. Contraception method is none. Postpartum depression screening: negative.  The following portions of the patient's history were reviewed and updated as appropriate: allergies, current medications, past family history, past medical history, past social history, past surgical history and problem list.  Review of Systems Pertinent items are noted in HPI.   Objective:    BP (!) 142/95 (BP Location: Right Arm, Patient Position: Sitting, Cuff Size: Normal)   Pulse (!) 102   Ht 5' (1.524 m)   Wt 160 lb 8 oz (72.8 kg)   Breastfeeding No   BMI 31.35 kg/m   General:  alert, cooperative and no distress   Breasts:  inspection negative, no nipple discharge or bleeding, no masses or nodularity palpable  Lungs:   Heart:    Abdomen: soft, non-tender; bowel sounds normal; no masses,  no organomegaly   Vulva:  normal  Vagina: normal vagina  Cervix:  no cervical motion tenderness and no lesions  Corpus: normal size, contour, position, consistency, mobility, non-tender  Adnexa:  normal adnexa  Rectal Exam:         Assessment:     Normal postpartum exam. Pap smear not done at today's visit.   Plan:    1. Contraception: oral progesterone-only contraceptive 2. Continue procardia and vasotec 3. Follow up in: 3 months or as needed.  BP check

## 2020-08-17 ENCOUNTER — Telehealth: Payer: Self-pay | Admitting: *Deleted

## 2020-08-17 ENCOUNTER — Other Ambulatory Visit: Payer: Self-pay | Admitting: Obstetrics & Gynecology

## 2020-08-17 MED ORDER — NIFEDIPINE ER 60 MG PO TB24
60.0000 mg | ORAL_TABLET | Freq: Every day | ORAL | 11 refills | Status: DC
Start: 2020-08-17 — End: 2021-09-06

## 2020-08-17 MED ORDER — ENALAPRIL MALEATE 10 MG PO TABS
10.0000 mg | ORAL_TABLET | Freq: Two times a day (BID) | ORAL | 11 refills | Status: DC
Start: 2020-08-17 — End: 2021-09-06

## 2020-08-17 NOTE — Telephone Encounter (Signed)
Patient states she thinks the quantity of her medications was not sent in correctly.  The Nifedipine is daily and 60 tablets were sent in and the Enalapril is BID and 30 tablets were sent in.  She is requesting this be corrected.

## 2020-08-17 NOTE — Telephone Encounter (Signed)
It was ordered correctly but quantity dispensed was incorrect, it was a duplicate script from her hospital discharge  The correct quantities have been sent in

## 2020-09-21 ENCOUNTER — Other Ambulatory Visit: Payer: Self-pay

## 2020-09-21 ENCOUNTER — Ambulatory Visit (INDEPENDENT_AMBULATORY_CARE_PROVIDER_SITE_OTHER): Payer: 59 | Admitting: *Deleted

## 2020-09-21 ENCOUNTER — Encounter: Payer: Self-pay | Admitting: *Deleted

## 2020-09-21 DIAGNOSIS — Z0130 Encounter for examination of blood pressure without abnormal findings: Secondary | ICD-10-CM

## 2020-09-21 NOTE — Progress Notes (Addendum)
   NURSE VISIT- BLOOD PRESSURE CHECK  SUBJECTIVE:  Nancy Benjamin is a 40 y.o. G69P0101 female here for BP check. She is postpartum, delivery date 05/01/20    HYPERTENSION ROS:  Pregnant/postpartum:  . Severe headaches that don't go away with tylenol/other medicines: No  . Visual changes (seeing spots/double/blurred vision) No  . Severe pain under right breast breast or in center of upper chest No  . Severe nausea/vomiting No  . Taking medicines as instructed yes    OBJECTIVE:  LMP 08/25/2020   Breastfeeding No   Appearance alert, well appearing, and in no distress.  ASSESSMENT: Postpartum  blood pressure check  PLAN: Discussed with Dr. Despina Hidden   Recommendations: no changes needed   Follow-up: for yearly   Malachy Mood  09/21/2020 12:07 PM   Attestation of Attending Supervision of Nursing Visit Encounter: Evaluation and management procedures were performed by the nursing staff under my supervision and collaboration.  I have reviewed the nurse's note and chart, and I agree with the management and plan.  Rockne Coons MD Attending Physician for the [facility] 09/21/2020 12:45 PM

## 2021-01-06 ENCOUNTER — Telehealth: Payer: Self-pay | Admitting: *Deleted

## 2021-01-06 ENCOUNTER — Other Ambulatory Visit: Payer: Self-pay | Admitting: Obstetrics & Gynecology

## 2021-01-06 MED ORDER — VENLAFAXINE HCL ER 150 MG PO CP24: 150.0000 mg | ORAL_CAPSULE | Freq: Every day | ORAL | 2 refills | Status: DC

## 2021-01-06 NOTE — Telephone Encounter (Signed)
Patient called requesting a 1 month refill on her Effexor.  She normally gets this from her PCP but she no longer goes to that practice and is in the process of finding a new one.  She has been out of the medication for 2 days and feels like she is going through some withdrawals. Asked if it could be refilled. Please advise.

## 2021-03-29 ENCOUNTER — Other Ambulatory Visit: Payer: Self-pay | Admitting: Obstetrics & Gynecology

## 2021-04-26 ENCOUNTER — Encounter: Payer: Self-pay | Admitting: Women's Health

## 2021-04-26 ENCOUNTER — Encounter: Payer: Self-pay | Admitting: *Deleted

## 2021-04-26 ENCOUNTER — Other Ambulatory Visit (HOSPITAL_COMMUNITY)
Admission: RE | Admit: 2021-04-26 | Discharge: 2021-04-26 | Disposition: A | Discharge status: Home / Self Care | Payer: 59 | Source: Ambulatory Visit | Attending: Women's Health | Admitting: Women's Health

## 2021-04-26 ENCOUNTER — Ambulatory Visit (INDEPENDENT_AMBULATORY_CARE_PROVIDER_SITE_OTHER): Payer: PRIVATE HEALTH INSURANCE | Admitting: Women's Health

## 2021-04-26 ENCOUNTER — Other Ambulatory Visit: Payer: Self-pay

## 2021-04-26 VITALS — BP 127/88 | HR 90 | Ht 60.0 in | Wt 144.0 lb

## 2021-04-26 DIAGNOSIS — Z3202 Encounter for pregnancy test, result negative: Secondary | ICD-10-CM

## 2021-04-26 DIAGNOSIS — N926 Irregular menstruation, unspecified: Secondary | ICD-10-CM | POA: Diagnosis present

## 2021-04-26 LAB — POCT URINE PREGNANCY: Preg Test, Ur: NEGATIVE

## 2021-04-26 NOTE — Progress Notes (Signed)
   GYN VISIT Patient name: Nancy Benjamin MRN [id number]  Date of birth: 1980-11-14 Chief Complaint:   Menstrual Problem  History of Present Illness:   Nancy Benjamin is a 40 y.o. G4P0101 Caucasian female being seen today for report of frequent periods since June.  Had baby last Sept, bottlefeeding. Started on micronor d/t HTN. Has had 1 period during 2nd week of pack since starting until June. Has had 5 periods since June, last about a week or so, not heavy. Denies abnormal discharge, itching/odor/irritation.  Has been under more stress lately. Did have ovarian cyst on early pregnancy u/s (at another location). No pain. Wants to stick w/ pills if we decide to switch up birth control. Does not smoke, no h/o DVT/PE, CVA, MI, or migraines w/ aura.  Patient's last menstrual period was 04/07/2021. The current method of family planning is oral progesterone-only contraceptive.  Last pap March 2020 . Results were: negative per pt report at office in [facility]  No flowsheet data found.  No flowsheet data found.   Review of Systems:   Pertinent items are noted in HPI Denies fever/chills, dizziness, headaches, visual disturbances, fatigue, shortness of breath, chest pain, abdominal pain, vomiting, abnormal vaginal discharge/itching/odor/irritation, problems with periods, bowel movements, urination, or intercourse unless otherwise stated above.  Pertinent History Reviewed:  Reviewed past medical,surgical, social, obstetrical and family history.  Reviewed problem list, medications and allergies. Physical Assessment:   Vitals:   04/26/21 1601  BP: 127/88  Pulse: 90  Weight: 144 lb (65.3 kg)  Height: 5' (1.524 m)  Body mass index is 28.12 kg/m.       Physical Examination:   General appearance: alert, well appearing, and in no distress  Mental status: alert, oriented to person, place, and time  Skin: warm & dry   Cardiovascular: normal heart rate noted  Respiratory: normal respiratory effort, no  distress  Abdomen: soft, non-tender   Pelvic: VULVA: normal appearing vulva with no masses, tenderness or lesions, VAGINA: normal appearing vagina with normal color and discharge, no lesions, CERVIX: normal appearing cervix without discharge or lesions, UTERUS: uterus is normal size, shape, consistency and nontender, ADNEXA: normal adnexa in size, nontender and no masses  Extremities: no edema   Chaperone: Faith Rogue    Results for orders placed or performed in visit on 04/26/21 (from the past 24 hour(s))  POCT urine pregnancy   Collection Time: 04/26/21  4:13 PM  Result Value Ref Range   Preg Test, Ur Negative Negative    Assessment & Plan:  1) Frequent periods> on micronor, CV swab sent, will contact pt w/ results- if neg plan to switch pills  Meds: No orders of the defined types were placed in this encounter.   Orders Placed This Encounter  Procedures   POCT urine pregnancy    Return for will call pt w/ results; get pap from [facility] please.  Cheral Marker CNM, [facility] 04/26/2021 4:33 PM

## 2021-04-28 LAB — CERVICOVAGINAL ANCILLARY ONLY
Bacterial Vaginitis (gardnerella): NEGATIVE
Candida Glabrata: NEGATIVE
Candida Vaginitis: NEGATIVE
Chlamydia: NEGATIVE
Comment: NEGATIVE
Comment: NEGATIVE
Comment: NEGATIVE
Comment: NEGATIVE
Comment: NEGATIVE
Comment: NORMAL
Neisseria Gonorrhea: NEGATIVE
Trichomonas: NEGATIVE

## 2021-05-03 ENCOUNTER — Other Ambulatory Visit: Payer: Self-pay | Admitting: Women's Health

## 2021-05-03 ENCOUNTER — Telehealth: Payer: Self-pay | Admitting: Women's Health

## 2021-05-03 MED ORDER — LO LOESTRIN FE 1 MG-10 MCG / 10 MCG PO TABS: 1.0000 | ORAL_TABLET | Freq: Every day | ORAL | 3 refills | Status: DC

## 2021-05-03 NOTE — Telephone Encounter (Signed)
Patient said Kim sent her a message via mychart and she wasn't able to message back. She said she was okay with switching bc and asked to start it next month.

## 2021-05-03 NOTE — Telephone Encounter (Signed)
Called pt and relayed Kim's msg. Used two identifiers. Scheduled f/u appt. Pt confirmed understanding.

## 2021-05-22 ENCOUNTER — Other Ambulatory Visit: Payer: Self-pay | Admitting: Obstetrics & Gynecology

## 2021-08-22 ENCOUNTER — Other Ambulatory Visit: Payer: Self-pay | Admitting: Obstetrics & Gynecology

## 2021-08-23 ENCOUNTER — Ambulatory Visit (INDEPENDENT_AMBULATORY_CARE_PROVIDER_SITE_OTHER): Payer: PRIVATE HEALTH INSURANCE | Admitting: Women's Health

## 2021-08-23 ENCOUNTER — Other Ambulatory Visit: Payer: Self-pay

## 2021-08-23 ENCOUNTER — Encounter: Payer: Self-pay | Admitting: Women's Health

## 2021-08-23 ENCOUNTER — Other Ambulatory Visit: Payer: Self-pay | Admitting: Obstetrics & Gynecology

## 2021-08-23 VITALS — BP 133/81 | HR 84 | Ht 60.0 in | Wt 142.0 lb

## 2021-08-23 DIAGNOSIS — Z3041 Encounter for surveillance of contraceptive pills: Secondary | ICD-10-CM | POA: Diagnosis not present

## 2021-08-23 NOTE — Progress Notes (Signed)
° °  GYN VISIT Patient name: Nancy Benjamin MRN [id number]  Date of birth: 1981/07/21 Chief Complaint:   Follow-up (On birth control)  History of Present Illness:   Nancy Benjamin is a 40 y.o. G29P0101 Caucasian female being seen today for f/u on LoLoestrin rx'd 05/03/21.   Doing well, bled during 2nd week on 1st 2 packs, hasn't had a period on 3rd pack. Did miss pill once about a month ago, and had unprotected sex around that time, multiple HPTs have been neg.  No LMP recorded. (Menstrual status: Oral contraceptives). The current method of family planning is OCP (estrogen/progesterone).  Last pap March 2020. Results were: negative per pt report at office in [facility]  No flowsheet data found.  No flowsheet data found.   Review of Systems:   Pertinent items are noted in HPI Denies fever/chills, dizziness, headaches, visual disturbances, fatigue, shortness of breath, chest pain, abdominal pain, vomiting, abnormal vaginal discharge/itching/odor/irritation, problems with periods, bowel movements, urination, or intercourse unless otherwise stated above.  Pertinent History Reviewed:  Reviewed past medical,surgical, social, obstetrical and family history.  Reviewed problem list, medications and allergies. Physical Assessment:   Vitals:   08/23/21 1551  BP: 133/81  Pulse: 84  Weight: 142 lb (64.4 kg)  Height: 5' (1.524 m)  Body mass index is 27.73 kg/m.       Physical Examination:   General appearance: alert, well appearing, and in no distress  Mental status: alert, oriented to person, place, and time  Skin: warm & dry   Cardiovascular: normal heart rate noted  Respiratory: normal respiratory effort, no distress  Abdomen: soft, non-tender   Pelvic: examination not indicated  Extremities: no edema   Chaperone: N/A    No results found for this or any previous visit (from the past 24 hour(s)).  Assessment & Plan:  1) Contraception surveillance> doing well, continue LoLo, discussed periods  can stop in some people, if misses pill and has sex- use condoms  Meds: No orders of the defined types were placed in this encounter.   No orders of the defined types were placed in this encounter.   Return for As scheduled.  Cheral Marker CNM, [facility] 08/23/2021 4:20 PM

## 2021-09-26 ENCOUNTER — Other Ambulatory Visit: Payer: PRIVATE HEALTH INSURANCE | Admitting: Women's Health

## 2021-10-05 ENCOUNTER — Encounter: Payer: Self-pay | Admitting: Adult Health

## 2021-10-05 ENCOUNTER — Other Ambulatory Visit (HOSPITAL_COMMUNITY)
Admission: RE | Admit: 2021-10-05 | Discharge: 2021-10-05 | Disposition: A | Discharge status: Home / Self Care | Payer: 59 | Source: Ambulatory Visit | Attending: Adult Health | Admitting: Adult Health

## 2021-10-05 ENCOUNTER — Other Ambulatory Visit: Payer: Self-pay

## 2021-10-05 ENCOUNTER — Ambulatory Visit (INDEPENDENT_AMBULATORY_CARE_PROVIDER_SITE_OTHER): Payer: PRIVATE HEALTH INSURANCE | Admitting: Adult Health

## 2021-10-05 VITALS — BP 116/85 | HR 112 | Ht 60.0 in | Wt 141.0 lb

## 2021-10-05 DIAGNOSIS — Z01419 Encounter for gynecological examination (general) (routine) without abnormal findings: Secondary | ICD-10-CM | POA: Insufficient documentation

## 2021-10-05 DIAGNOSIS — Z3041 Encounter for surveillance of contraceptive pills: Secondary | ICD-10-CM | POA: Insufficient documentation

## 2021-10-05 NOTE — Progress Notes (Signed)
Patient ID: Nancy Benjamin, female   DOB: 05/04/1981, 41 y.o.   MRN: [id number] History of Present Illness: Nancy Benjamin is a 41 year old white female,married,G1P0101, in for a well woman gyn exam and requests a pap. She was seen today for spot,?bug bite, vs shingles on left temporal area. PCP is at [facility] in [place].   Current Medications, Allergies, Past Medical History, Past Surgical History, Family History and Social History were reviewed in [employer] record.     Review of Systems: Patient denies any headaches, hearing loss,  blurred vision, shortness of breath, chest pain, abdominal pain, problems with bowel movements, urination, or intercourse. No joint pain or mood swings.  +tired, has had normal labs she says    Physical Exam:BP 116/85 (BP Location: Right Arm, Patient Position: Sitting, Cuff Size: Normal)    Pulse (!) 112    Ht 5' (1.524 m)    Wt 141 lb (64 kg)    LMP 10/03/2021 Comment: having some spotting this week   Breastfeeding No    BMI 27.54 kg/m   General:  Well developed, well nourished, no acute distress Skin:  Warm and dry,has round 3 cm red raised area left temporal area Neck:  Midline trachea, normal thyroid, good ROM, no lymphadenopathy Lungs; Clear to auscultation bilaterally Breast:  No dominant palpable mass, retraction, or nipple discharge Cardiovascular: Regular rate and rhythm Abdomen:  Soft, non tender, no hepatosplenomegaly Pelvic:  External genitalia is normal in appearance, no lesions.  The vagina is normal in appearance.+brown blood in vault. Urethra has no lesions or masses. The cervix is smooth, pap with HR HPV genotyping performed.  Uterus is felt to be normal size, shape, and contour.  No adnexal masses or tenderness noted.Bladder is non tender, no masses felt. Rectal:Deferred. Extremities/musculoskeletal:  No swelling or varicosities noted, no clubbing or cyanosis Psych:  No mood changes, alert and cooperative,seems happy AA is  3 Fall risk is low Depression screen PHQ 2/9 10/05/2021  Decreased Interest 1  Down, Depressed, Hopeless 0  PHQ - 2 Score 1  Altered sleeping 1  Tired, decreased energy 1  Change in appetite 0  Feeling bad or failure about yourself  0  Trouble concentrating 0  Moving slowly or fidgety/restless 0  Suicidal thoughts 0  PHQ-9 Score 3    GAD 7 : Generalized Anxiety Score 10/05/2021  Nervous, Anxious, on Edge 0  Control/stop worrying 0  Worry too much - different things 1  Trouble relaxing 0  Restless 0  Easily annoyed or irritable 1  Afraid - awful might happen 0  Total GAD 7 Score 2    Upstream - 10/05/21 1126       Pregnancy Intention Screening   Does the patient want to become pregnant in the next year? Unsure    Does the patient's partner want to become pregnant in the next year? Unsure    Would the patient like to discuss contraceptive options today? No      Contraception Wrap Up   Current Method Oral Contraceptive    End Method Oral Contraceptive    Contraception Counseling Provided No              Co exam with Lorraine Lax NP student   Impression and Plan: 1. Encounter for gynecological examination with Papanicolaou smear of cervix Pap sent Pap in 3 years if normal Gyn physical in 1 year   2. Encounter for surveillance of contraceptive pills Continue Lo Loestrin, has refills

## 2021-10-07 LAB — CYTOLOGY - PAP
Comment: NEGATIVE
Diagnosis: NEGATIVE
High risk HPV: NEGATIVE

## 2022-03-18 IMAGING — US US MFM FETAL BPP W/O NON-STRESS
1 series · 14 of 28 positions shown · non-contrast
Comparison: none

[Series 1: us mfm fetal bpp w/o non-stress · 56 acquisitions, 14 frames shown]
[im 3/56]
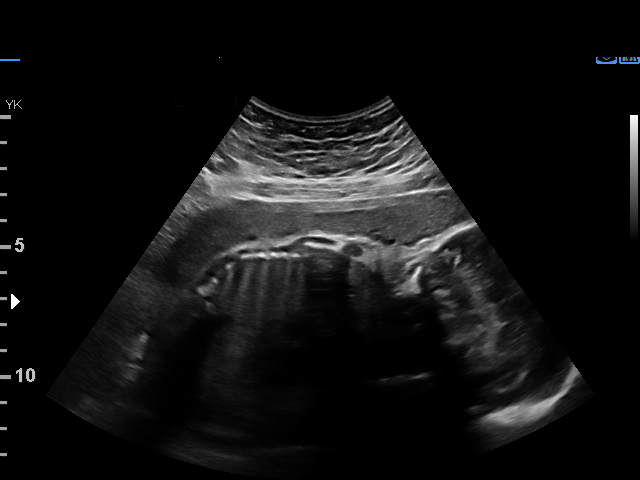
[im 7/56]
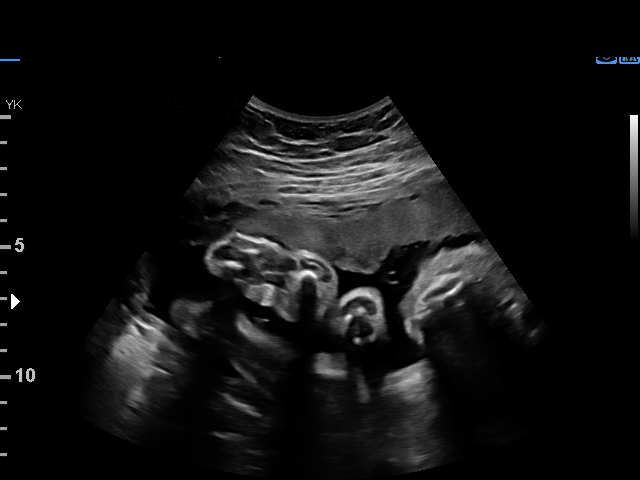
[im 11/56]
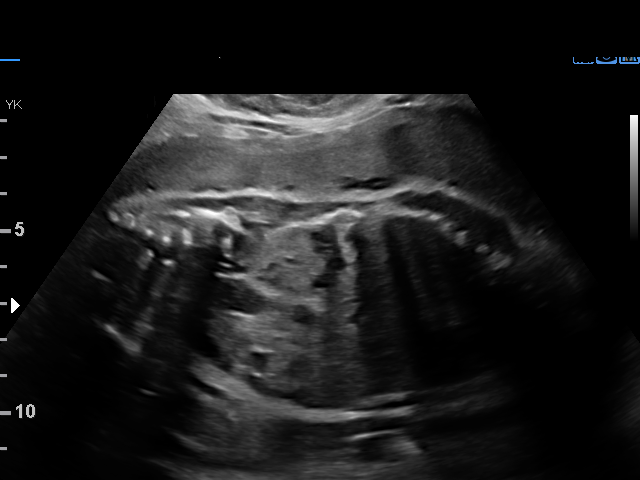
[im 15/56]
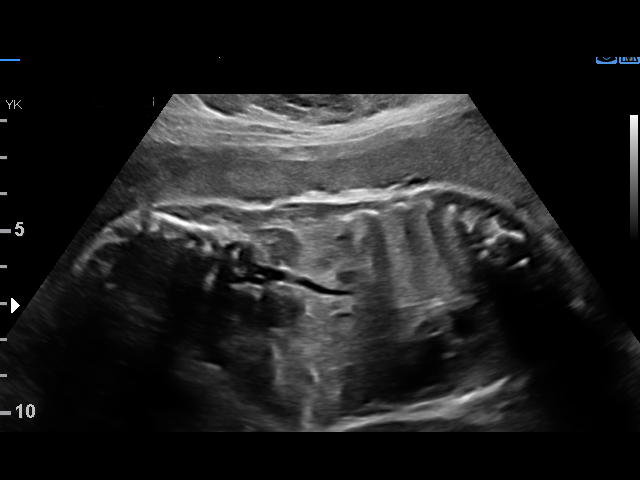
[im 19/56]
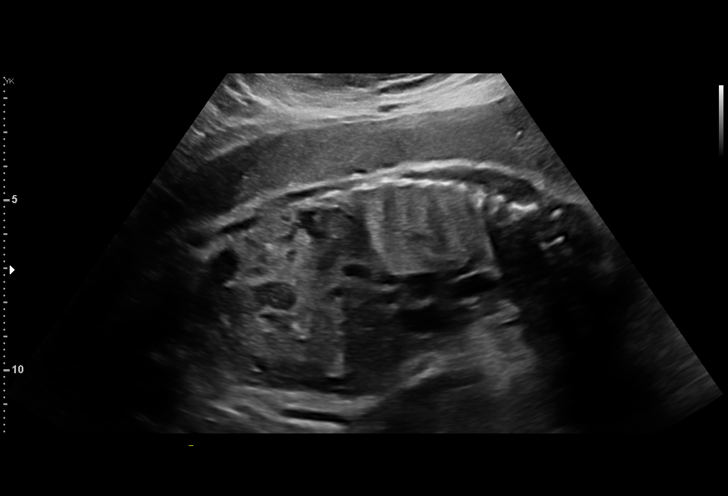
[im 23/56]
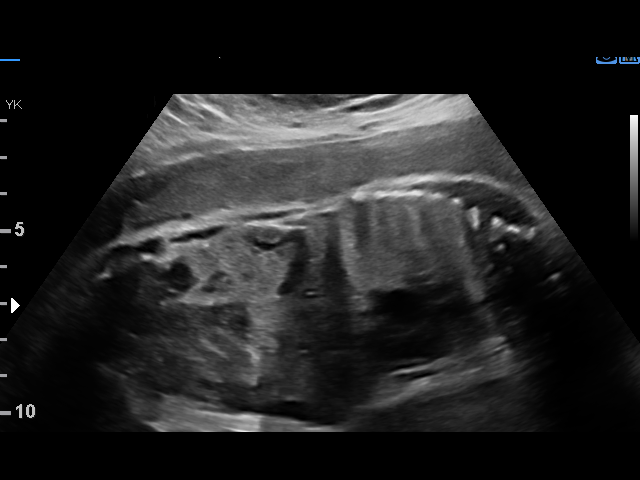
[im 27/56]
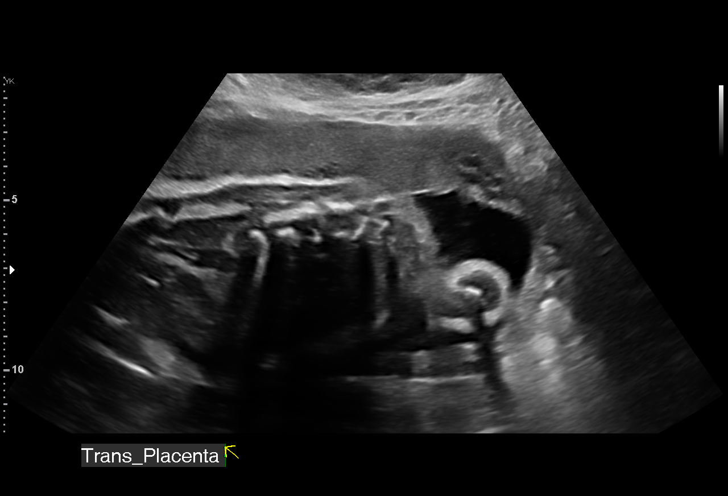
[im 31/56]
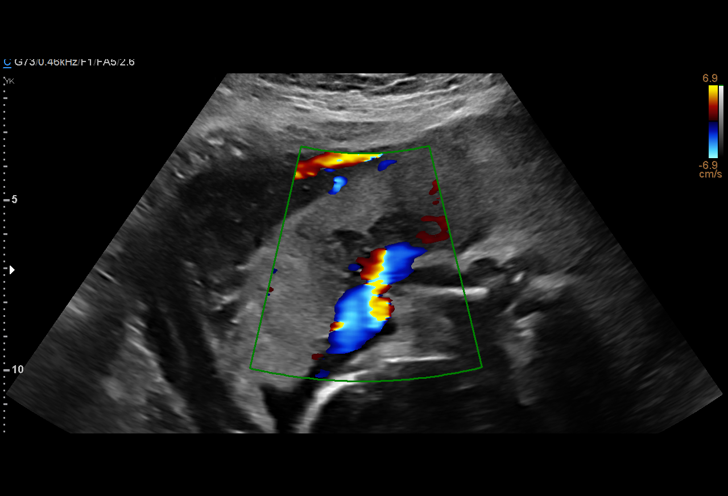
[im 35/56]
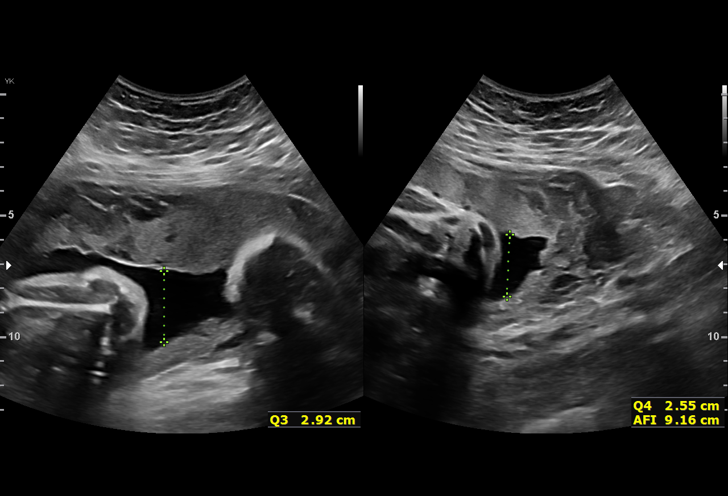
[im 39/56]
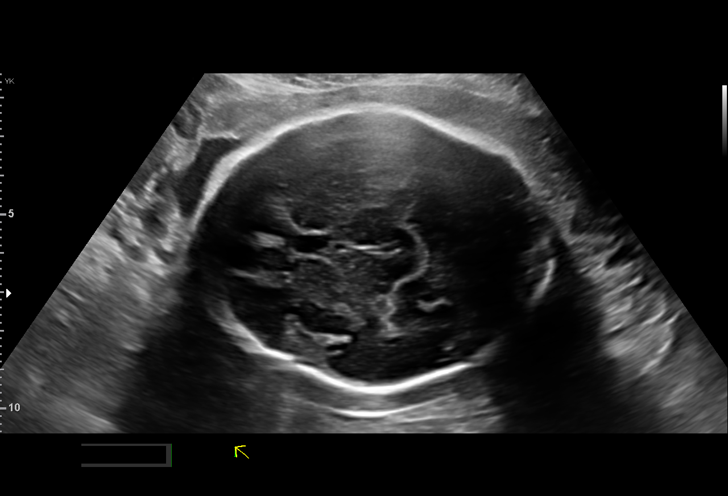
[im 43/56]
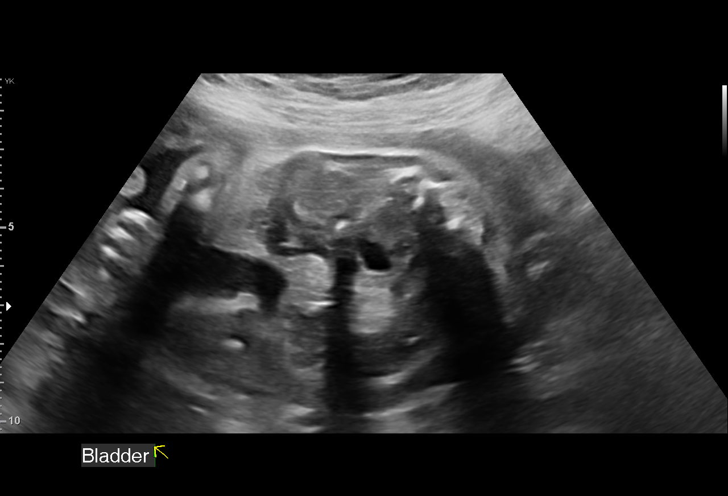
[im 47/56]
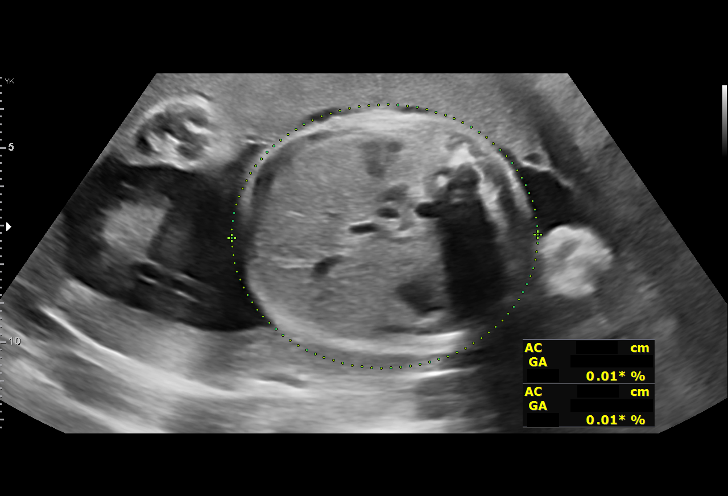
[im 51/56]
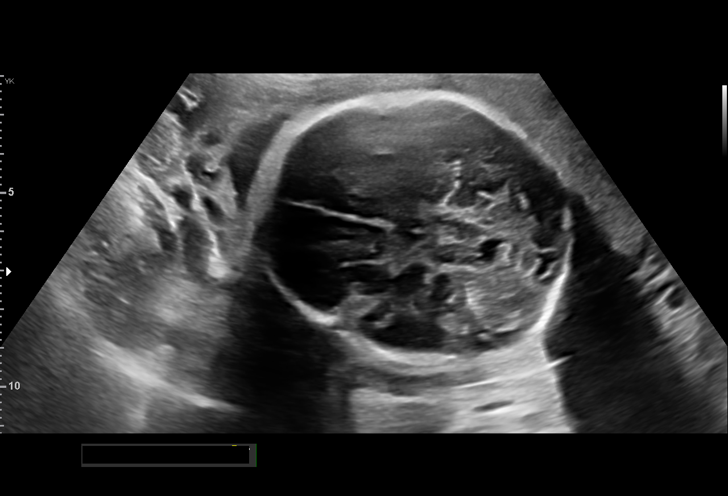
[im 56/56]
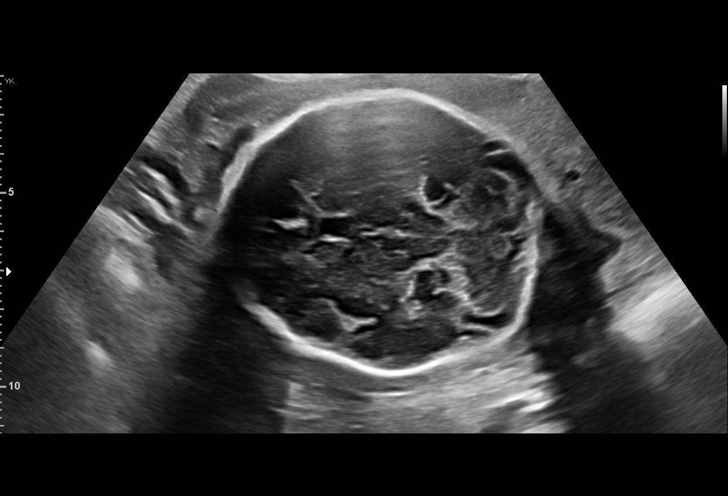

[14 of 28 positions shown; findings below may reference images not displayed]

[facility] NP

 2  US MFM OB LIMITED                     76815.01    [facility] DATABEX
 3  US MFM UA CORD DOPPLER                76820.02    [facility] DATABEX

Indications

 Decreased fetal movement
 Gestational diabetes in pregnancy,
 unspecified control
 Hypertension - Chronic/Pre-existing
 Encounter for fetal growth retardation
 Unspecified preeclampsia, third trimester
 Maternal care for known or suspected poor
 fetal growth, third trimester, not applicable or
 unspecified IUGR
 Advanced maternal age primigravida 35+,
 third trimester
 32 weeks gestation of pregnancy
Fetal Evaluation

 Num Of Fetuses:          1
 Fetal Heart Rate(bpm):   127
 Cardiac Activity:        Observed
 Presentation:            Cephalic
 Placenta:                Anterior
 P. Cord Insertion:       Visualized, central

 Amniotic Fluid
 AFI FV:      Within normal limits

 AFI Sum(cm)     %Tile       Largest Pocket(cm)
 10.33           19
 RUQ(cm)       RLQ(cm)       LUQ(cm)        LLQ(cm)

Biophysical Evaluation

 Amniotic F.V:   Within normal limits       F. Tone:         Observed
 F. Movement:    Observed                   Score:           [DATE]
 F. Breathing:   Observed
Biometry

 BPD:      72.9  mm     G. Age:  29w 2d        < 1  %    CI:        77.31   %    70 - 86
                                                         FL/HC:       20.2  %    19.1 -
 HC:      262.5  mm     G. Age:  28w 4d        < 1  %    HC/AC:       1.15       0.96 -
 AC:      228.9  mm     G. Age:  27w 2d        < 1  %    FL/BPD:      72.6  %    71 - 87
 FL:       52.9  mm     G. Age:  28w 1d        < 1  %    FL/AC:       23.1  %    20 - 24

 Est. FW:    1121   gm     2 lb 8 oz    < 1  %
OB History

 Gravidity:    1
Gestational Age

 LMP:           32w 2d        Date:  09/18/19                 EDD:   06/24/20
 U/S Today:     28w 2d                                        EDD:   07/22/20
 Best:          32w 2d     Det. By:  LMP  (09/18/19)          EDD:   06/24/20
Anatomy

 Cranium:               Appears normal         Diaphragm:              Appears normal
 Cavum:                 Appears normal         Stomach:                Appears normal, left
                                                                       sided
 Ventricles:            Appears normal         Kidneys:                Appear normal
 Cerebellum:            Appears normal         Bladder:                Appears normal
 Posterior Fossa:       Appears normal
Doppler - Fetal Vessels

 Umbilical Artery
   S/D    %tile      RI    %tile      PI    %tile            ADFV    RDFV
  6.36   > 97.5    0.84   > 97.5    1.94   > 97.5              Yes      No

Impression

 Limited exam due to decreased fetal movememt
 IUGR noted today with EFW < 1% with intermittent absent
 EDF
 There is good fetal movement and amniotic fluid volume.
 Biophysical profile [DATE]
Recommendations

 Offer Betamethsone if not previously administered.
 Continue 2x weekly UA Dopplers with 2x weekly BPP.
 Consider delivery by 34 weeks if intermittement absent
 persist. If UA Dopplers evolve to REDF or if abnormal
 antenatal testing consider delivery.

## 2022-06-16 ENCOUNTER — Other Ambulatory Visit: Payer: Self-pay | Admitting: Obstetrics & Gynecology

## 2022-06-16 DIAGNOSIS — O3680X0 Pregnancy with inconclusive fetal viability, not applicable or unspecified: Secondary | ICD-10-CM

## 2022-06-19 ENCOUNTER — Ambulatory Visit (INDEPENDENT_AMBULATORY_CARE_PROVIDER_SITE_OTHER): Payer: PRIVATE HEALTH INSURANCE | Admitting: *Deleted

## 2022-06-19 ENCOUNTER — Ambulatory Visit (INDEPENDENT_AMBULATORY_CARE_PROVIDER_SITE_OTHER): Payer: PRIVATE HEALTH INSURANCE

## 2022-06-19 ENCOUNTER — Encounter: Payer: Self-pay | Admitting: *Deleted

## 2022-06-19 VITALS — BP 150/99 | HR 104 | Wt 157.0 lb

## 2022-06-19 DIAGNOSIS — O09299 Supervision of pregnancy with other poor reproductive or obstetric history, unspecified trimester: Secondary | ICD-10-CM | POA: Insufficient documentation

## 2022-06-19 DIAGNOSIS — O10919 Unspecified pre-existing hypertension complicating pregnancy, unspecified trimester: Secondary | ICD-10-CM

## 2022-06-19 DIAGNOSIS — O3680X0 Pregnancy with inconclusive fetal viability, not applicable or unspecified: Secondary | ICD-10-CM

## 2022-06-19 DIAGNOSIS — Z98891 History of uterine scar from previous surgery: Secondary | ICD-10-CM | POA: Insufficient documentation

## 2022-06-19 DIAGNOSIS — O0991 Supervision of high risk pregnancy, unspecified, first trimester: Secondary | ICD-10-CM

## 2022-06-19 DIAGNOSIS — Z3A08 8 weeks gestation of pregnancy: Secondary | ICD-10-CM | POA: Diagnosis not present

## 2022-06-19 DIAGNOSIS — O0990 Supervision of high risk pregnancy, unspecified, unspecified trimester: Secondary | ICD-10-CM | POA: Insufficient documentation

## 2022-06-19 DIAGNOSIS — Z8759 Personal history of other complications of pregnancy, childbirth and the puerperium: Secondary | ICD-10-CM

## 2022-06-19 NOTE — Progress Notes (Signed)
[place] 8+4 wks,single IUP with yolk sac,FHR 177 bpm,normal ovaries,CRL 19.83 mm

## 2022-06-19 NOTE — Progress Notes (Addendum)
   INITIAL OB NURSE INTAKE  SUBJECTIVE:  Nancy Benjamin is a 41 y.o. G99P0101 female [redacted]w[redacted]d by LMP c/w today's U/S with an Estimated Date of Delivery: 01/25/23 being seen today for her initial OB intake/educational visit with RN. She is taking prenatal vitamins.  She is not having nausea and/or vomiting and does not request nausea meds at this time.  Patient's last menstrual period was 04/20/2022.  Patient's medical, surgical, and obstetrical history obtained and reviewed.  Current medications reviewed. Questionable medicines were reviewed with a provider.   Patient Active Problem List   Diagnosis Date Noted   History of cesarean delivery 06/19/2022   History of gestational diabetes in prior pregnancy, currently pregnant 06/19/2022   Supervision of high-risk pregnancy 06/19/2022   Past Medical History:  Diagnosis Date   Diabetes mellitus without complication ([place])    gestational   Hypertension    patient reports htn since "mid 20's"   Past Surgical History:  Procedure Laterality Date   CESAREAN SECTION N/A 05/01/2020   Procedure: CESAREAN SECTION;  Surgeon: Chancy Milroy, MD;  Location: [facility] LD ORS;  Service: Obstetrics;  Laterality: N/A;   WISDOM TOOTH EXTRACTION     OB History     Gravida  2   Para  1   Term      Preterm  1   AB      Living  1      SAB      IAB      Ectopic      Multiple  0   Live Births  1           OBJECTIVE:  BP (!) 150/99   Pulse (!) 104   Wt 157 lb (71.2 kg)   LMP 04/20/2022   BMI 30.66 kg/m   ASSESSMENT/PLAN: [id number] at [redacted]w[redacted]d with an Estimated Date of Delivery: 01/25/23  Prenatal vitamins: continue   Nausea medicines: not currently needed   OB packet given: Yes BabyScripts and MyChart activated  Blood Pressure Cuff: has at home. Discussed to be used for virtual visits and home BP checks.  Genetic & carrier screening discussed: requests Panorama and NT/IT, declines Horizon  Placed OB Box on problem list and  updated Reviewed recommended weight gain based on pre-gravid BMI BMI 25-29.9, gain max 15-25lb  Discussed BP's with Dr Elonda Husky and will increase Labetalol to 200mg  TID.  Needs new script sent in to pharmacy.  Follow-up in 4 weeks for NT U/S & New OB visit with provider  Face-to-face time at least 30 minutes. 50% or more of this visit was spent in counseling and coordination of care.  Kristeen Miss Avelyn Touch RN-C 06/19/2022 5:04 PM   Chart and initial new ob nurse intake visit note reviewed and agree with plan of care. Labetalol 200mg  TID rx sent.  Justice, [facility] 06/20/2022 10:42 AM

## 2022-06-20 DIAGNOSIS — O10919 Unspecified pre-existing hypertension complicating pregnancy, unspecified trimester: Secondary | ICD-10-CM | POA: Insufficient documentation

## 2022-06-20 DIAGNOSIS — O09299 Supervision of pregnancy with other poor reproductive or obstetric history, unspecified trimester: Secondary | ICD-10-CM | POA: Insufficient documentation

## 2022-06-20 DIAGNOSIS — Z8759 Personal history of other complications of pregnancy, childbirth and the puerperium: Secondary | ICD-10-CM | POA: Insufficient documentation

## 2022-06-20 MED ORDER — LABETALOL HCL 200 MG PO TABS: 200.0000 mg | ORAL_TABLET | Freq: Three times a day (TID) | ORAL | 8 refills | Status: DC

## 2022-06-20 NOTE — Addendum Note (Signed)
Addended by: Roma Schanz on: 06/20/2022 10:43 AM   Modules accepted: Orders

## 2022-06-25 ENCOUNTER — Encounter: Payer: Self-pay | Admitting: Obstetrics & Gynecology

## 2022-06-27 ENCOUNTER — Telehealth: Payer: Self-pay | Admitting: Advanced Practice Midwife

## 2022-06-27 NOTE — Telephone Encounter (Signed)
The patient calling wanting to know what the code for the blood work is that is billed for the insurance. States that insurance needs the cpt code so that they can tell her if it is covered. She states if you would just send a my chart message that would be great.

## 2022-06-29 ENCOUNTER — Encounter: Payer: Self-pay | Admitting: Advanced Practice Midwife

## 2022-07-12 ENCOUNTER — Encounter: Payer: Self-pay | Admitting: Advanced Practice Midwife

## 2022-07-13 ENCOUNTER — Encounter: Payer: Self-pay | Admitting: Advanced Practice Midwife

## 2022-07-19 ENCOUNTER — Other Ambulatory Visit: Payer: Self-pay | Admitting: Obstetrics & Gynecology

## 2022-07-19 DIAGNOSIS — Z3682 Encounter for antenatal screening for nuchal translucency: Secondary | ICD-10-CM

## 2022-07-24 ENCOUNTER — Ambulatory Visit (INDEPENDENT_AMBULATORY_CARE_PROVIDER_SITE_OTHER): Payer: PRIVATE HEALTH INSURANCE

## 2022-07-24 ENCOUNTER — Ambulatory Visit (INDEPENDENT_AMBULATORY_CARE_PROVIDER_SITE_OTHER): Payer: PRIVATE HEALTH INSURANCE | Admitting: Advanced Practice Midwife

## 2022-07-24 VITALS — BP 152/101 | HR 85 | Wt 162.0 lb

## 2022-07-24 DIAGNOSIS — O0991 Supervision of high risk pregnancy, unspecified, first trimester: Secondary | ICD-10-CM

## 2022-07-24 DIAGNOSIS — Z3A13 13 weeks gestation of pregnancy: Secondary | ICD-10-CM

## 2022-07-24 DIAGNOSIS — O0992 Supervision of high risk pregnancy, unspecified, second trimester: Secondary | ICD-10-CM

## 2022-07-24 DIAGNOSIS — O09299 Supervision of pregnancy with other poor reproductive or obstetric history, unspecified trimester: Secondary | ICD-10-CM | POA: Diagnosis not present

## 2022-07-24 DIAGNOSIS — Z3682 Encounter for antenatal screening for nuchal translucency: Secondary | ICD-10-CM

## 2022-07-24 DIAGNOSIS — Z8632 Personal history of gestational diabetes: Secondary | ICD-10-CM

## 2022-07-24 DIAGNOSIS — Z131 Encounter for screening for diabetes mellitus: Secondary | ICD-10-CM

## 2022-07-24 DIAGNOSIS — O10919 Unspecified pre-existing hypertension complicating pregnancy, unspecified trimester: Secondary | ICD-10-CM

## 2022-07-24 LAB — POCT URINALYSIS DIPSTICK OB
Blood, UA: NEGATIVE
Glucose, UA: NEGATIVE
Ketones, UA: NEGATIVE
Leukocytes, UA: NEGATIVE
Nitrite, UA: NEGATIVE
POC,PROTEIN,UA: NEGATIVE

## 2022-07-24 LAB — HEPATITIS C ANTIBODY: HCV Ab: NEGATIVE

## 2022-07-24 LAB — OB RESULTS CONSOLE RUBELLA ANTIBODY, IGM: Rubella: IMMUNE

## 2022-07-24 LAB — OB RESULTS CONSOLE HIV ANTIBODY (ROUTINE TESTING): HIV: NONREACTIVE

## 2022-07-24 LAB — OB RESULTS CONSOLE HEPATITIS B SURFACE ANTIGEN: Hepatitis B Surface Ag: NEGATIVE

## 2022-07-24 LAB — OB RESULTS CONSOLE RPR: RPR: NONREACTIVE

## 2022-07-24 MED ORDER — ASPIRIN 81 MG PO TBEC: 162.0000 mg | DELAYED_RELEASE_TABLET | Freq: Every day | ORAL | 6 refills | Status: DC

## 2022-07-24 NOTE — Progress Notes (Signed)
INITIAL OBSTETRICAL VISIT Patient name: Nancy Benjamin MRN [id number]  Date of birth: 1981/03/06 Chief Complaint:   Initial Prenatal Visit  History of Present Illness:   Nancy Benjamin is a 41 y.o. G9P0101  female at [redacted]w[redacted]d by LMP c/w u/s at 8 weeks with an Estimated Date of Delivery: 01/25/23 being seen today for her initial obstetrical visit.   Her obstetrical history is significant for AMA, CHTN, hx GDM and FGR.   Today she reports heartburn takes priolosec 20mg  in am. May add a pM dose.  Has had a very hard time a day with TID labetalol.  Will change to BID     07/24/2022    4:00 PM 10/05/2021   11:26 AM  Depression screen PHQ 2/9  Decreased Interest 0 1  Down, Depressed, Hopeless 0 0  PHQ - 2 Score 0 1  Altered sleeping 0 1  Tired, decreased energy 1 1  Change in appetite 0 0  Feeling bad or failure about yourself  0 0  Trouble concentrating 0 0  Moving slowly or fidgety/restless 0 0  Suicidal thoughts 0 0  PHQ-9 Score 1 3    Patient's last menstrual period was 04/20/2022. Last pap 10/05/21. Results were: normal Review of Systems:   Pertinent items are noted in HPI Denies cramping/contractions, leakage of fluid, vaginal bleeding, abnormal vaginal discharge w/ itching/odor/irritation, headaches, visual changes, shortness of breath, chest pain, abdominal pain, severe nausea/vomiting, or problems with urination or bowel movements unless otherwise stated above.  Pertinent History Reviewed:  Reviewed past medical,surgical, social, obstetrical and family history.  Reviewed problem list, medications and allergies. OB History  Gravida Para Term Preterm AB Living  2 1   1   1   SAB IAB Ectopic Multiple Live Births        0 1    # Outcome Date GA Lbr Len/2nd Weight Sex Delivery Anes PTL Lv  2 Current           1 Preterm 05/01/20 [redacted]w[redacted]d  2 lb 6.5 oz (1.09 kg) F CS-LTranv Spinal N LIV     Complications: Pre-eclampsia, IUGR (intrauterine growth restriction) affecting care of mother    Physical Assessment:   Vitals:   07/24/22 1553 07/24/22 1600  BP: (!) 153/96 (!) 152/101  Pulse: 82 85  Weight: 162 lb (73.5 kg)   Body mass index is 31.64 kg/m.       Physical Examination:  General appearance - well appearing, and in no distress  Mental status - alert, oriented to person, place, and time  Psych:  She has a normal mood and affect  Skin - warm and dry, normal color, no suspicious lesions noted  Chest - effort normal  Heart - normal rate and regular rhythm  Abdomen - soft, nontender  Extremities:  No swelling or varicosities noted    TODAY'S NT 13+4 wks,measurements c/w dates,CRL 77.35 mm,NT 1.8 mm,NB present,normal ovaries,FHR 152 bpm,anterior placenta   Results for orders placed or performed in visit on 07/24/22 (from the past 24 hour(s))  POC Urinalysis Dipstick OB   Collection Time: 07/24/22  3:57 PM  Result Value Ref Range   Color, UA     Clarity, UA     Glucose, UA Negative Negative   Bilirubin, UA     Ketones, UA neg    Spec Grav, UA     Blood, UA neg    pH, UA     POC,PROTEIN,UA Negative Negative, Trace, Small (1+), Moderate (2+), Large (3+), 4+  Urobilinogen, UA     Nitrite, UA neg    Leukocytes, UA Negative Negative   Appearance     Odor      Assessment & Plan:  1) High-Risk Pregnancy G2P0101 at [redacted]w[redacted]d with an Estimated Date of Delivery: 01/25/23   2) Initial OB visit  3) CHTN:  ^ labetalol to 400mg  BID; asa 162mg   4)  Hx GDM:  A1c today  5)  Hx FGR and is AMA:  testing per [facility] guidelines  Meds: No orders of the defined types were placed in this encounter.   Initial labs obtained Continue prenatal vitamins Reviewed n/v relief measures and warning s/s to report Reviewed recommended weight gain based on pre-gravid BMI Encouraged well-balanced diet Genetic & carrier screening discussed: requests Panorama and NT/IT, declines AFP and Horizon  Ultrasound discussed; fetal survey: requested CCNC completed> form faxed if has or is  planning to apply for medicaid The nature of [facility] for with multiple MDs and other Advanced Practice Providers was explained to patient; also emphasized that fellows, residents, and students are part of our team. Has home bp cuff. Check bp weekly, let [facility] know if >140/90.        [employer] [place] 4:13 PM

## 2022-07-24 NOTE — Patient Instructions (Signed)

## 2022-07-24 NOTE — Progress Notes (Signed)
[place] 13+4 wks,measurements c/w dates,CRL 77.35 mm,NT 1.8 mm,NB present,normal ovaries,FHR 152 bpm,anterior placenta

## 2022-07-26 LAB — GC/CHLAMYDIA PROBE AMP
Chlamydia trachomatis, NAA: NEGATIVE
Neisseria Gonorrhoeae by PCR: NEGATIVE

## 2022-07-26 LAB — CBC/D/PLT+RPR+RH+ABO+RUBIGG...
Antibody Screen: NEGATIVE
Basophils Absolute: 0.1 10*3/uL (ref 0.0–0.2)
Basos: 1 %
EOS (ABSOLUTE): 0.2 10*3/uL (ref 0.0–0.4)
Eos: 2 %
HCV Ab: NONREACTIVE
HIV Screen 4th Generation wRfx: NONREACTIVE
Hematocrit: 40.2 % (ref 34.0–46.6)
Hemoglobin: 14.1 g/dL (ref 11.1–15.9)
Hepatitis B Surface Ag: NEGATIVE
Immature Grans (Abs): 0.1 10*3/uL (ref 0.0–0.1)
Immature Granulocytes: 1 %
Lymphocytes Absolute: 3 10*3/uL (ref 0.7–3.1)
Lymphs: 25 %
MCH: 32.6 pg (ref 26.6–33.0)
MCHC: 35.1 g/dL (ref 31.5–35.7)
MCV: 93 fL (ref 79–97)
Monocytes Absolute: 0.9 10*3/uL (ref 0.1–0.9)
Monocytes: 8 %
Neutrophils Absolute: 7.7 10*3/uL — ABNORMAL HIGH (ref 1.4–7.0)
Neutrophils: 63 %
Platelets: 272 10*3/uL (ref 150–450)
RBC: 4.32 x10E6/uL (ref 3.77–5.28)
RDW: 11.5 % — ABNORMAL LOW (ref 11.7–15.4)
RPR Ser Ql: NONREACTIVE
Rh Factor: POSITIVE
Rubella Antibodies, IGG: 1.55 index (ref >0.99)
WBC: 12.1 10*3/uL — ABNORMAL HIGH (ref 3.4–10.8)

## 2022-07-26 LAB — COMPREHENSIVE METABOLIC PANEL
ALT: 14 IU/L (ref 0–32)
AST: 13 IU/L (ref 0–40)
Albumin/Globulin Ratio: 1.6 (ref 1.2–2.2)
Albumin: 4.3 g/dL (ref 3.9–4.9)
Alkaline Phosphatase: 68 IU/L (ref 44–121)
BUN/Creatinine Ratio: 11 (ref 9–23)
BUN: 7 mg/dL (ref 6–20)
Bilirubin Total: <0.2 mg/dL (ref 0.0–1.2)
CO2: 20 mmol/L (ref 20–29)
Calcium: 9.7 mg/dL (ref 8.7–10.2)
Chloride: 103 mmol/L (ref 96–106)
Creatinine, Ser: 0.61 mg/dL (ref 0.57–1.00)
Globulin, Total: 2.7 g/dL (ref 1.5–4.5)
Glucose: 71 mg/dL (ref 70–99)
Potassium: 4 mmol/L (ref 3.5–5.2)
Sodium: 137 mmol/L (ref 134–144)
Total Protein: 7 g/dL (ref 6.0–8.5)
eGFR: 117 mL/min/{1.73_m2} (ref >59)

## 2022-07-26 LAB — PROTEIN / CREATININE RATIO, URINE
Creatinine, Urine: 53.2 mg/dL
Protein, Ur: 5.3 mg/dL
Protein/Creat Ratio: 100 mg/g creat (ref 0–200)

## 2022-07-26 LAB — URINE CULTURE

## 2022-07-26 LAB — INTEGRATED 1
Crown Rump Length: 77.4 mm
Gest. Age on Collection Date: 13.6 weeks
Maternal Age at EDD: 39.9 yr
Nuchal Translucency (NT): 1.8 mm
Number of Fetuses: 1
PAPP-A Value: 1269.7 ng/mL
Weight: 162 [lb_av]

## 2022-07-26 LAB — HEMOGLOBIN A1C
Est. average glucose Bld gHb Est-mCnc: 100 mg/dL
Hgb A1c MFr Bld: 5.1 % (ref 4.8–5.6)

## 2022-07-26 LAB — HCV INTERPRETATION

## 2022-07-31 ENCOUNTER — Telehealth (INDEPENDENT_AMBULATORY_CARE_PROVIDER_SITE_OTHER): Payer: PRIVATE HEALTH INSURANCE | Admitting: *Deleted

## 2022-07-31 VITALS — BP 148/102

## 2022-07-31 DIAGNOSIS — Z87891 Personal history of nicotine dependence: Secondary | ICD-10-CM

## 2022-07-31 DIAGNOSIS — O0992 Supervision of high risk pregnancy, unspecified, second trimester: Secondary | ICD-10-CM

## 2022-07-31 DIAGNOSIS — O99891 Other specified diseases and conditions complicating pregnancy: Secondary | ICD-10-CM

## 2022-07-31 DIAGNOSIS — Z3A14 14 weeks gestation of pregnancy: Secondary | ICD-10-CM

## 2022-07-31 DIAGNOSIS — O10912 Unspecified pre-existing hypertension complicating pregnancy, second trimester: Secondary | ICD-10-CM

## 2022-07-31 DIAGNOSIS — O10919 Unspecified pre-existing hypertension complicating pregnancy, unspecified trimester: Secondary | ICD-10-CM

## 2022-07-31 LAB — PANORAMA PRENATAL TEST FULL PANEL:PANORAMA TEST PLUS 5 ADDITIONAL MICRODELETIONS: FETAL FRACTION: 6.2

## 2022-07-31 NOTE — Progress Notes (Signed)
   NURSE VISIT- BLOOD PRESSURE CHECK  I connected withNAME@ on 07/31/2022 by MyChart video and verified that I am speaking with the correct person using two identifiers.   I discussed the limitations of evaluation and management by telemedicine. The patient expressed understanding and agreed to proceed.  Nurse is at the office, and patient is at work.  SUBJECTIVE:  Nancy Benjamin is a 41 y.o. G80P0101 female here for BP check. She is [redacted]w[redacted]d pregnant    HYPERTENSION ROS:  Pregnant:  Severe headaches that don't go away with tylenol/other medicines: No  Visual changes (seeing spots/double/blurred vision) No  Severe pain under right breast breast or in center of upper chest No  Severe nausea/vomiting No  Taking medicines as instructed yes  OBJECTIVE:  BP (!) 148/102 (BP Location: Left Arm, Patient Position: Sitting, Cuff Size: Normal)   LMP 04/20/2022   Appearance alert, well appearing, and in no distress.  ASSESSMENT: Pregnancy [redacted]w[redacted]d  blood pressure check  PLAN: Discussed with Dr. Despina Hidden   Recommendations:  Take Labetalol 400mg  TID    Follow-up: in 2 weeks for mychart BP check with nurse    07/31/2022 11:21 AM

## 2022-08-08 ENCOUNTER — Encounter: Payer: Self-pay | Admitting: *Deleted

## 2022-08-09 ENCOUNTER — Telehealth: Payer: Self-pay | Admitting: *Deleted

## 2022-08-09 NOTE — Telephone Encounter (Signed)
I spoke with pt. Pt's BP is 136/95. Pt is not having any symptoms, just indigestion from what she ate. Pt has a virtual BP nurse visit tomorrow. Pt was advised to keep that appt. Call with any concerns or after hours nurse line if office is closed. Pt voiced understanding. JSY

## 2022-08-10 ENCOUNTER — Encounter: Payer: Self-pay | Admitting: Advanced Practice Midwife

## 2022-08-10 ENCOUNTER — Other Ambulatory Visit: Payer: Self-pay | Admitting: Obstetrics & Gynecology

## 2022-08-10 ENCOUNTER — Telehealth (INDEPENDENT_AMBULATORY_CARE_PROVIDER_SITE_OTHER): Payer: PRIVATE HEALTH INSURANCE | Admitting: *Deleted

## 2022-08-10 VITALS — BP 136/100

## 2022-08-10 DIAGNOSIS — Z87891 Personal history of nicotine dependence: Secondary | ICD-10-CM

## 2022-08-10 DIAGNOSIS — O10919 Unspecified pre-existing hypertension complicating pregnancy, unspecified trimester: Secondary | ICD-10-CM

## 2022-08-10 DIAGNOSIS — O10012 Pre-existing essential hypertension complicating pregnancy, second trimester: Secondary | ICD-10-CM

## 2022-08-10 DIAGNOSIS — O0992 Supervision of high risk pregnancy, unspecified, second trimester: Secondary | ICD-10-CM

## 2022-08-10 DIAGNOSIS — Z3A16 16 weeks gestation of pregnancy: Secondary | ICD-10-CM

## 2022-08-10 DIAGNOSIS — O99332 Smoking (tobacco) complicating pregnancy, second trimester: Secondary | ICD-10-CM

## 2022-08-10 MED ORDER — LABETALOL HCL 200 MG PO TABS: 600.0000 mg | ORAL_TABLET | Freq: Three times a day (TID) | ORAL | 8 refills | Status: DC

## 2022-08-10 NOTE — Progress Notes (Signed)
Rx for Labetalol

## 2022-08-10 NOTE — Progress Notes (Signed)
   NURSE VISIT- BLOOD PRESSURE CHECK  I connected withNAME@ on 08/10/2022 by MyChart video and verified that I am speaking with the correct person using two identifiers.   I discussed the limitations of evaluation and management by telemedicine. The patient expressed understanding and agreed to proceed.  Nurse is at the office, and patient is at work.  SUBJECTIVE:  Nancy Benjamin is a 41 y.o. G20P0101 female here for BP check. She is [redacted]w[redacted]d pregnant    HYPERTENSION ROS:  Pregnant:  Severe headaches that don't go away with tylenol/other medicines: No however, does develop headache about 2 hours after taking mid-day dose Visual changes (seeing spots/double/blurred vision) No  Severe pain under right breast breast or in center of upper chest No  Severe nausea/vomiting No  Taking medicines as instructed yes  Diastolic still in the low 100's during the day  Also with c/o nasal congestion, head/nasal pressure for 1 week.  Has been taking Mucinex along with Caritin but symptoms are not getting better.   OBJECTIVE:  BP (!) 136/100 (BP Location: Left Arm, Patient Position: Sitting, Cuff Size: Large)   LMP 04/20/2022   Appearance alert, well appearing, and in no distress.  ASSESSMENT: Pregnancy [redacted]w[redacted]d  blood pressure check  PLAN: Discussed with Dr. Charlotta Newton   Recommendations:  Increase Labetalol  to 600 TID, will also send in Z-Pak Follow-up:  1 week for virtual bp check    Jobe Marker  08/10/2022 2:38 PM

## 2022-08-11 ENCOUNTER — Other Ambulatory Visit: Payer: Self-pay | Admitting: Obstetrics & Gynecology

## 2022-08-11 DIAGNOSIS — J0100 Acute maxillary sinusitis, unspecified: Secondary | ICD-10-CM

## 2022-08-11 MED ORDER — AZITHROMYCIN 250 MG PO TABS: ORAL_TABLET | ORAL | 0 refills | Status: DC

## 2022-08-11 NOTE — Progress Notes (Signed)
Rx for z pak due to sinus infection

## 2022-08-18 ENCOUNTER — Telehealth (INDEPENDENT_AMBULATORY_CARE_PROVIDER_SITE_OTHER): Payer: PRIVATE HEALTH INSURANCE | Admitting: *Deleted

## 2022-08-18 ENCOUNTER — Telehealth: Payer: Self-pay | Admitting: *Deleted

## 2022-08-18 VITALS — BP 114/82

## 2022-08-18 DIAGNOSIS — O99891 Other specified diseases and conditions complicating pregnancy: Secondary | ICD-10-CM

## 2022-08-18 DIAGNOSIS — Z3A17 17 weeks gestation of pregnancy: Secondary | ICD-10-CM

## 2022-08-18 DIAGNOSIS — Z87891 Personal history of nicotine dependence: Secondary | ICD-10-CM

## 2022-08-18 DIAGNOSIS — O10919 Unspecified pre-existing hypertension complicating pregnancy, unspecified trimester: Secondary | ICD-10-CM

## 2022-08-18 DIAGNOSIS — O10012 Pre-existing essential hypertension complicating pregnancy, second trimester: Secondary | ICD-10-CM

## 2022-08-18 NOTE — Telephone Encounter (Signed)
Patient called stating she had found out some information on a medicine and wanted to talk to you. Please advise.

## 2022-08-18 NOTE — Telephone Encounter (Signed)
Patient states she contacted her pharmacy and she has enough medication to cover her until Tuesday but will then need to pay out of pocket until she can refill it on 08/27/22.

## 2022-08-18 NOTE — Progress Notes (Signed)
   NURSE VISIT- BLOOD PRESSURE CHECK  I connected with Shela Leff on 08/18/2022 by MyChart video and verified that I am speaking with the correct person using two identifiers.   I discussed the limitations of evaluation and management by telemedicine. The patient expressed understanding and agreed to proceed.  Nurse is at the office, and patient is at home.  SUBJECTIVE:  Nancy Benjamin is a 41 y.o. G36P0101 female here for BP check. She is [redacted]w[redacted]d pregnant    HYPERTENSION ROS:  Pregnant:  Severe headaches that don't go away with tylenol/other medicines: No  Visual changes (seeing spots/double/blurred vision) No  Severe pain under right breast breast or in center of upper chest No  Severe nausea/vomiting No  Taking medicines as instructed yes  States she gets light headed and a headache about 45 minutes to 1 hour after taking the mid-day dose   OBJECTIVE:  BP 114/82 (BP Location: Left Arm, Patient Position: Sitting, Cuff Size: Normal)   LMP 04/20/2022   Appearance alert, well appearing, and in no distress.  ASSESSMENT: Pregnancy [redacted]w[redacted]d  blood pressure check  PLAN: Discussed with Dr. Despina Hidden   Recommendations:  decrease mid-day dose to 400mg     Follow-up: as scheduled    08/18/2022 9:19 AM

## 2022-08-23 ENCOUNTER — Ambulatory Visit (INDEPENDENT_AMBULATORY_CARE_PROVIDER_SITE_OTHER): Payer: PRIVATE HEALTH INSURANCE | Admitting: Obstetrics & Gynecology

## 2022-08-23 ENCOUNTER — Encounter: Payer: Self-pay | Admitting: Obstetrics & Gynecology

## 2022-08-23 VITALS — BP 132/96 | HR 92 | Wt 163.0 lb

## 2022-08-23 DIAGNOSIS — O0992 Supervision of high risk pregnancy, unspecified, second trimester: Secondary | ICD-10-CM

## 2022-08-23 DIAGNOSIS — O10919 Unspecified pre-existing hypertension complicating pregnancy, unspecified trimester: Secondary | ICD-10-CM

## 2022-08-23 DIAGNOSIS — Z3A17 17 weeks gestation of pregnancy: Secondary | ICD-10-CM

## 2022-08-23 LAB — POCT URINALYSIS DIPSTICK OB
Blood, UA: NEGATIVE
Glucose, UA: NEGATIVE
Ketones, UA: NEGATIVE
Leukocytes, UA: NEGATIVE
Nitrite, UA: NEGATIVE
POC,PROTEIN,UA: NEGATIVE

## 2022-08-23 NOTE — Progress Notes (Signed)
HIGH-RISK PREGNANCY VISIT Patient name: Nancy Benjamin MRN [id number]  Date of birth: 05-14-1981 Chief Complaint:   Routine Prenatal Visit  History of Present Illness:   Nancy Benjamin is a 41 y.o. G19P0101 female at [redacted]w[redacted]d with an Estimated Date of Delivery: 01/25/23 being seen today for ongoing management of a high-risk pregnancy complicated by: -Chronic HTN- on Labetalol 600mg  BID, 400mg  in the afternoon -prior C-section  Today she reports that she "threw out her back" from coughing.  Mentioned something to Dr. Elonda Husky last visit and still not feeling better despite heat.  Contractions: Not present. Vag. Bleeding: None.  Not yet feeling fetal movement- maybe flutters . denies leaking of fluid.      07/24/2022    4:00 PM 10/05/2021   11:26 AM  Depression screen PHQ 2/9  Decreased Interest 0 1  Down, Depressed, Hopeless 0 0  PHQ - 2 Score 0 1  Altered sleeping 0 1  Tired, decreased energy 1 1  Change in appetite 0 0  Feeling bad or failure about yourself  0 0  Trouble concentrating 0 0  Moving slowly or fidgety/restless 0 0  Suicidal thoughts 0 0  PHQ-9 Score 1 3     Current Outpatient Medications  Medication Instructions   aspirin EC 162 mg, Oral, Daily   azithromycin (ZITHROMAX) 250 MG tablet Take 2 tablets for two days then take one tablet daily   cetirizine (ZYRTEC ALLERGY) 10 MG tablet No dose, route, or frequency recorded.   cholecalciferol (VITAMIN D3) 2,000 Units, Oral, Daily   guaiFENesin (MUCINEX) 600 mg, 2 times daily   labetalol (NORMODYNE) 600 mg, Oral, 3 times daily   omeprazole (PRILOSEC OTC) 20 mg, Oral, Daily   Prenatal Vit-Fe Sulfate-FA-DHA (PRENATAL VITAMIN/MIN +DHA PO) No dose, route, or frequency recorded.     Review of Systems:   Pertinent items are noted in HPI Denies abnormal vaginal discharge w/ itching/odor/irritation, headaches, visual changes, shortness of breath, chest pain, abdominal pain, severe nausea/vomiting, or problems with urination or  bowel movements unless otherwise stated above. Pertinent History Reviewed:  Reviewed past medical,surgical, social, obstetrical and family history.  Reviewed problem list, medications and allergies. Physical Assessment:   Vitals:   08/23/22 1014 08/23/22 1017  BP: (!) 131/93 (!) 132/96  Pulse: 79 92  Weight: 163 lb (73.9 kg)   Body mass index is 31.83 kg/m.           Physical Examination:   General appearance: alert, well appearing, and in no distress  Mental status: normal mood, behavior, speech, dress, motor activity, and thought processes  Skin: warm & dry   Extremities: Edema: None    Cardiovascular: normal heart rate noted  Respiratory: normal respiratory effort, no distress  Abdomen: gravid, soft, non-tender  Pelvic: Cervical exam deferred         Fetal Status: Fetal Heart Rate (bpm): 145        Fetal Surveillance Testing today: doppler   Chaperone: N/A    Results for orders placed or performed in visit on 08/23/22 (from the past 24 hour(s))  POC Urinalysis Dipstick OB   Collection Time: 08/23/22 10:16 AM  Result Value Ref Range   Color, UA     Clarity, UA     Glucose, UA Negative Negative   Bilirubin, UA     Ketones, UA negative    Spec Grav, UA     Blood, UA negative    pH, UA     POC,PROTEIN,UA Negative Negative, Trace, Small (1+),  Moderate (2+), Large (3+), 4+   Urobilinogen, UA     Nitrite, UA negative    Leukocytes, UA Negative Negative   Appearance     Odor       Assessment & Plan:  High-risk pregnancy: G2P0101 at [redacted]w[redacted]d with an Estimated Date of Delivery: 01/25/23   1) Chronic HTN -continue current medication 600/400/600mg  Labetalol -plan for growth q 4wks, anatomy scheduled  2) Prior C-section -leaning towards repeat with BTL -she does not desire another pregnancy  3) Musculoskeletal pain -reviewed conservative management -may consider short course of flexeril if no improvement  Meds: No orders of the defined types were placed in this  encounter.   Labs/procedures today: IT-2  Treatment Plan:  as outlined above  Reviewed: Preterm labor symptoms and general obstetric precautions including but not limited to vaginal bleeding, contractions, leaking of fluid and fetal movement were reviewed in detail with the patient.  All questions were answered. Pt has home bp cuff. Check bp weekly, let us know if >140/90.   Follow-up: Return for as scheduled 1/11 and growth every 4wk.   Future Appointments  Date Time Provider Department Center  09/07/2022  3:00 PM [facility] [place] [facility] None  09/07/2022  3:50 PM Myna Hidalgo, DO [facility]    Orders Placed This Encounter  Procedures   INTEGRATED 2   POC Urinalysis Dipstick OB    Myna Hidalgo, DO Attending Obstetrician & Gynecologist, [facility] for [employer], [facility]

## 2022-08-25 ENCOUNTER — Encounter: Payer: Self-pay | Admitting: Obstetrics & Gynecology

## 2022-08-25 LAB — INTEGRATED 2
AFP MoM: 4.27
Alpha-Fetoprotein: 165.3 ng/mL
Crown Rump Length: 77.4 mm
DIA MoM: 4.5
DIA Value: 653.2 pg/mL
Estriol, Unconjugated: 0.46 ng/mL
Gest. Age on Collection Date: 13.6 weeks
Gestational Age: 17.9 weeks
Maternal Age at EDD: 39.9 yr
Nuchal Translucency (NT): 1.8 mm
Nuchal Translucency MoM: 1
Number of Fetuses: 1
PAPP-A MoM: 0.94
PAPP-A Value: 1269.7 ng/mL
Test Results:: POSITIVE — AB
Weight: 162 [lb_av]
Weight: 162 [lb_av]
hCG MoM: 1.82
hCG Value: 46.9 IU/mL
uE3 MoM: 0.34

## 2022-08-25 MED ORDER — CYCLOBENZAPRINE HCL 10 MG PO TABS: 10.0000 mg | ORAL_TABLET | Freq: Three times a day (TID) | ORAL | 1 refills | Status: DC | PRN

## 2022-08-28 NOTE — L&D Delivery Note (Addendum)
Delivery Note Nancy Benjamin is a 42 y.o. G2P0101 at 63w4dadmitted for IOL in the setting of IUFD.   GBS Status: unknown   Maximum Maternal Temperature: 101.6  Labor course: Initial SVE: closed/thick. Augmentation with: Intra-amniotic hemabate, Pitocin, Cytotec, and IP Foley. She then progressed to complete.  ROM: rupture date, rupture time, delivery date, or delivery time have not been documented   Birth: At [place] non-viable female was delivered en-caul via spontaneous vaginal delivery and vaginal birth after cesarean (VBAC) (Presentation: cephalic). Nuchal cord present: No.  Shoulders and body delivered in usual fashion. Infant placed directly on mom's abdomen. The placenta delivered with the fetus.  Pitocin infused rapidly IV per protocol. Fundus firm with massage.  Sponge and instrument count were correct x2.  Intrapartum complications: IUFD Anesthesia:  epidural Episiotomy: none Lacerations:  none Suture Repair: n/a EBL (mL): 25   Infant: Apgar (1 minute): 0 Apgars (5 minute): 0 Infant weight: pending  Mom to postpartum. Baby to morgue. Placenta to Pathology for IUFD    Contraception: OCP's Circumcision: N/A  [facility].   DRenee HarderCNM 10/23/2022 7:15 PM

## 2022-08-30 ENCOUNTER — Encounter: Payer: Self-pay | Admitting: Obstetrics & Gynecology

## 2022-09-06 ENCOUNTER — Other Ambulatory Visit: Payer: Self-pay | Admitting: Obstetrics & Gynecology

## 2022-09-06 DIAGNOSIS — Z363 Encounter for antenatal screening for malformations: Secondary | ICD-10-CM

## 2022-09-07 ENCOUNTER — Encounter: Payer: Self-pay | Admitting: Obstetrics & Gynecology

## 2022-09-07 ENCOUNTER — Ambulatory Visit (INDEPENDENT_AMBULATORY_CARE_PROVIDER_SITE_OTHER): Payer: 59

## 2022-09-07 ENCOUNTER — Ambulatory Visit (INDEPENDENT_AMBULATORY_CARE_PROVIDER_SITE_OTHER): Payer: 59 | Admitting: Obstetrics & Gynecology

## 2022-09-07 ENCOUNTER — Other Ambulatory Visit: Payer: Self-pay | Admitting: Obstetrics & Gynecology

## 2022-09-07 VITALS — BP 137/94 | HR 68 | Wt 164.0 lb

## 2022-09-07 DIAGNOSIS — O365990 Maternal care for other known or suspected poor fetal growth, unspecified trimester, not applicable or unspecified: Secondary | ICD-10-CM

## 2022-09-07 DIAGNOSIS — O365920 Maternal care for other known or suspected poor fetal growth, second trimester, not applicable or unspecified: Secondary | ICD-10-CM

## 2022-09-07 DIAGNOSIS — O10919 Unspecified pre-existing hypertension complicating pregnancy, unspecified trimester: Secondary | ICD-10-CM

## 2022-09-07 DIAGNOSIS — F419 Anxiety disorder, unspecified: Secondary | ICD-10-CM

## 2022-09-07 DIAGNOSIS — Z3A20 20 weeks gestation of pregnancy: Secondary | ICD-10-CM | POA: Diagnosis not present

## 2022-09-07 DIAGNOSIS — Z363 Encounter for antenatal screening for malformations: Secondary | ICD-10-CM

## 2022-09-07 DIAGNOSIS — O0992 Supervision of high risk pregnancy, unspecified, second trimester: Secondary | ICD-10-CM

## 2022-09-07 DIAGNOSIS — Z98891 History of uterine scar from previous surgery: Secondary | ICD-10-CM

## 2022-09-07 LAB — POCT URINALYSIS DIPSTICK OB
Blood, UA: NEGATIVE
Glucose, UA: NEGATIVE
Ketones, UA: NEGATIVE
Leukocytes, UA: NEGATIVE
Nitrite, UA: NEGATIVE
POC,PROTEIN,UA: NEGATIVE

## 2022-09-07 MED ORDER — HYDROXYZINE HCL 25 MG PO TABS: 25.0000 mg | ORAL_TABLET | Freq: Every evening | ORAL | 6 refills | Status: DC | PRN

## 2022-09-07 MED ORDER — LABETALOL HCL 200 MG PO TABS: 600.0000 mg | ORAL_TABLET | Freq: Three times a day (TID) | ORAL | 6 refills | Status: DC

## 2022-09-07 MED ORDER — SERTRALINE HCL 25 MG PO TABS: 25.0000 mg | ORAL_TABLET | Freq: Every day | ORAL | 1 refills | Status: DC

## 2022-09-07 NOTE — Progress Notes (Signed)
HIGH-RISK PREGNANCY VISIT Patient name: Nancy Benjamin MRN [id number]  Date of birth: 03-23-1981 Chief Complaint:   Routine Prenatal Visit  History of Present Illness:   Nancy Benjamin is a 42 y.o. G15P0101 female at [redacted]w[redacted]d with an Estimated Date of Delivery: 01/25/23 being seen today for ongoing management of a high-risk pregnancy complicated by:  -Chronic HTN- on Labetalol 600mg  tid -prior C-section  History of FGR with preeclampsia -abnormal IT2, normal panorama  Today she reports  anxiety- she notes that she is waking up in the middle of the night- almost having a panic attack.  She will check her BP and it will be high, which also causes her to panic .  Of note, previously treated for anxiety and notes she has taken many medications in the past.  Contractions: Not present. Vag. Bleeding: None.  Movement: Present. denies leaking of fluid.      07/24/2022    4:00 PM 10/05/2021   11:26 AM  Depression screen PHQ 2/9  Decreased Interest 0 1  Down, Depressed, Hopeless 0 0  PHQ - 2 Score 0 1  Altered sleeping 0 1  Tired, decreased energy 1 1  Change in appetite 0 0  Feeling bad or failure about yourself  0 0  Trouble concentrating 0 0  Moving slowly or fidgety/restless 0 0  Suicidal thoughts 0 0  PHQ-9 Score 1 3     Current Outpatient Medications  Medication Instructions   aspirin EC 162 mg, Oral, Daily   cetirizine (ZYRTEC ALLERGY) 10 MG tablet No dose, route, or frequency recorded.   cholecalciferol (VITAMIN D3) 2,000 Units, Oral, Daily   cyclobenzaprine (FLEXERIL) 10 mg, Oral, Every 8 hours PRN   guaiFENesin (MUCINEX) 600 mg, 2 times daily   hydrOXYzine (ATARAX) 25 mg, Oral, At bedtime PRN   labetalol (NORMODYNE) 600 mg, Oral, 3 times daily   omeprazole (PRILOSEC OTC) 20 mg, Oral, Daily   Prenatal Vit-Fe Sulfate-FA-DHA (PRENATAL VITAMIN/MIN +DHA PO) No dose, route, or frequency recorded.   sertraline (ZOLOFT) 25 mg, Oral, Daily     Review of Systems:   Pertinent items  are noted in HPI Denies abnormal vaginal discharge w/ itching/odor/irritation, headaches, visual changes, shortness of breath, chest pain, abdominal pain, severe nausea/vomiting, or problems with urination or bowel movements unless otherwise stated above. Pertinent History Reviewed:  Reviewed past medical,surgical, social, obstetrical and family history.  Reviewed problem list, medications and allergies. Physical Assessment:   Vitals:   09/07/22 1558 09/07/22 1601  BP: (!) 152/91 (!) 137/94  Pulse: 77 68  Weight: 164 lb (74.4 kg)   Body mass index is 32.03 kg/m.           Physical Examination:   General appearance: alert, well appearing, and in no distress  Mental status: normal mood, behavior, speech, dress, motor activity, and thought processes  Skin: warm & dry   Extremities: Edema: None    Cardiovascular: normal heart rate noted  Respiratory: normal respiratory effort, no distress  Abdomen: gravid, soft, non-tender  Pelvic: Cervical exam deferred         Fetal Status:     Movement: Present    Fetal Surveillance Testing today: breech,anterior placenta gr 0,normal ovaries,cx 4.4 cm,svp of fluid 4.1 cm,FHR 133 bpm,EFW 203 g .2%,AC 1.6%,elevated UAD with absent EDF,RI 99%,limited view of face,NB and heart,need additional images    Chaperone: N/A    Results for orders placed or performed in visit on 09/07/22 (from the past 24 hour(s))  POC Urinalysis Dipstick  OB   Collection Time: 09/07/22  4:02 PM  Result Value Ref Range   Color, UA     Clarity, UA     Glucose, UA Negative Negative   Bilirubin, UA     Ketones, UA neg    Spec Grav, UA     Blood, UA neg    pH, UA     POC,PROTEIN,UA Negative Negative, Trace, Small (1+), Moderate (2+), Large (3+), 4+   Urobilinogen, UA     Nitrite, UA neg    Leukocytes, UA Negative Negative   Appearance     Odor       Assessment & Plan:  High-risk pregnancy: G2P0101 at [redacted]w[redacted]d with an Estimated Date of Delivery: 01/25/23   1) Fetal  growth restriction, abnormal IT2 -discussed findings, reviewed concerns regarding possible demise, possible hospitalization and early delivery -while IT2 abnormal, panorama was normal.  Testing is only screening and advise consultation with MFM regarding further testing and findings -plan for follow up US with MFM in 3wks and doppler -then plan for weekly dopplers and serial growth scan  2) Chronic HTN -BP high/normal and notes that she feels like the Labetalol is making her dizzy -concerned about anxiety being a component of her elevated blood pressure -plan to manage anxiety then will consider increasing and/or changing medication  3) anxiety -start zoloft 25mg  daily with plans to increase at next visit -atarax at night prn  Meds:  Meds ordered this encounter  Medications   labetalol (NORMODYNE) 200 MG tablet    Sig: Take 3 tablets (600 mg total) by mouth 3 (three) times daily.    Dispense:  270 tablet    Refill:  6   sertraline (ZOLOFT) 25 MG tablet    Sig: Take 1 tablet (25 mg total) by mouth daily.    Dispense:  30 tablet    Refill:  1   hydrOXYzine (ATARAX) 25 MG tablet    Sig: Take 1 tablet (25 mg total) by mouth at bedtime as needed for anxiety.    Dispense:  30 tablet    Refill:  6    Labs/procedures today: anatomy scan  Treatment Plan:  see above, MFM in 3 wks, next visit with here in 4 wks with weekly dopplers  Reviewed: Preterm labor symptoms and general obstetric precautions including but not limited to vaginal bleeding, contractions, leaking of fluid and fetal movement were reviewed in detail with the patient.  All questions were answered. Pt has home bp cuff. Check bp weekly, let us know if >140/90.   Follow-up: Return in about 4 weeks (around 10/05/2022) for HROB visit and growth scan.   Orders Placed This Encounter  Procedures   [place] MFM OB DETAIL +[phone] [place]   POC Urinalysis Dipstick OB    Janyth Pupa, DO Attending [place], [facility] for [employer], [place]

## 2022-09-07 NOTE — Progress Notes (Signed)
[place] 20 wks,breech,anterior placenta gr 0,normal ovaries,cx 4.4 cm,svp of fluid 4.1 cm,FHR 133 bpm,EFW 203 g  .2%,AC 1.6%,elevated UAD with absent EDF,RI 99%,limited view of face,NB and heart,need additional images

## 2022-09-08 ENCOUNTER — Other Ambulatory Visit: Payer: 59

## 2022-09-08 ENCOUNTER — Ambulatory Visit: Payer: 59

## 2022-09-08 ENCOUNTER — Encounter: Payer: Self-pay | Admitting: Obstetrics & Gynecology

## 2022-09-11 ENCOUNTER — Other Ambulatory Visit: Payer: Self-pay | Admitting: Obstetrics & Gynecology

## 2022-09-11 DIAGNOSIS — O365990 Maternal care for other known or suspected poor fetal growth, unspecified trimester, not applicable or unspecified: Secondary | ICD-10-CM

## 2022-09-11 NOTE — Progress Notes (Signed)
Order placed

## 2022-09-12 ENCOUNTER — Telehealth: Payer: Self-pay | Admitting: Obstetrics & Gynecology

## 2022-09-12 NOTE — Telephone Encounter (Signed)
Patient wants to know if her provider appointments the weeks after her ultrasounds at [facility] can be virtual. She does have a home bp cuff. Please advise.

## 2022-09-14 ENCOUNTER — Encounter: Payer: Self-pay | Admitting: Obstetrics & Gynecology

## 2022-09-14 MED ORDER — AMOXICILLIN-POT CLAVULANATE 875-125 MG PO TABS: 1.0000 | ORAL_TABLET | Freq: Two times a day (BID) | ORAL | 0 refills | Status: DC

## 2022-10-02 ENCOUNTER — Ambulatory Visit: Payer: 59 | Attending: Obstetrics & Gynecology | Admitting: *Deleted

## 2022-10-02 ENCOUNTER — Other Ambulatory Visit: Payer: Self-pay | Admitting: Obstetrics & Gynecology

## 2022-10-02 ENCOUNTER — Ambulatory Visit (HOSPITAL_BASED_OUTPATIENT_CLINIC_OR_DEPARTMENT_OTHER): Payer: 59

## 2022-10-02 ENCOUNTER — Encounter: Payer: Self-pay | Admitting: *Deleted

## 2022-10-02 ENCOUNTER — Ambulatory Visit (HOSPITAL_BASED_OUTPATIENT_CLINIC_OR_DEPARTMENT_OTHER): Payer: 59 | Admitting: Obstetrics

## 2022-10-02 VITALS — BP 104/69 | HR 86

## 2022-10-02 DIAGNOSIS — O10912 Unspecified pre-existing hypertension complicating pregnancy, second trimester: Secondary | ICD-10-CM

## 2022-10-02 DIAGNOSIS — Z8632 Personal history of gestational diabetes: Secondary | ICD-10-CM

## 2022-10-02 DIAGNOSIS — Z3A23 23 weeks gestation of pregnancy: Secondary | ICD-10-CM

## 2022-10-02 DIAGNOSIS — O280 Abnormal hematological finding on antenatal screening of mother: Secondary | ICD-10-CM

## 2022-10-02 DIAGNOSIS — O289 Unspecified abnormal findings on antenatal screening of mother: Secondary | ICD-10-CM | POA: Insufficient documentation

## 2022-10-02 DIAGNOSIS — O09522 Supervision of elderly multigravida, second trimester: Secondary | ICD-10-CM | POA: Diagnosis not present

## 2022-10-02 DIAGNOSIS — O34219 Maternal care for unspecified type scar from previous cesarean delivery: Secondary | ICD-10-CM

## 2022-10-02 DIAGNOSIS — O10012 Pre-existing essential hypertension complicating pregnancy, second trimester: Secondary | ICD-10-CM

## 2022-10-02 DIAGNOSIS — O365920 Maternal care for other known or suspected poor fetal growth, second trimester, not applicable or unspecified: Secondary | ICD-10-CM

## 2022-10-02 DIAGNOSIS — O0992 Supervision of high risk pregnancy, unspecified, second trimester: Secondary | ICD-10-CM

## 2022-10-02 DIAGNOSIS — O09212 Supervision of pregnancy with history of pre-term labor, second trimester: Secondary | ICD-10-CM | POA: Diagnosis not present

## 2022-10-02 DIAGNOSIS — Z363 Encounter for antenatal screening for malformations: Secondary | ICD-10-CM | POA: Diagnosis present

## 2022-10-02 DIAGNOSIS — O365990 Maternal care for other known or suspected poor fetal growth, unspecified trimester, not applicable or unspecified: Secondary | ICD-10-CM

## 2022-10-02 DIAGNOSIS — O09292 Supervision of pregnancy with other poor reproductive or obstetric history, second trimester: Secondary | ICD-10-CM

## 2022-10-02 DIAGNOSIS — O281 Abnormal biochemical finding on antenatal screening of mother: Secondary | ICD-10-CM | POA: Diagnosis not present

## 2022-10-03 ENCOUNTER — Ambulatory Visit (INDEPENDENT_AMBULATORY_CARE_PROVIDER_SITE_OTHER): Payer: 59 | Admitting: Obstetrics & Gynecology

## 2022-10-03 VITALS — BP 144/100 | HR 87 | Wt 170.0 lb

## 2022-10-03 DIAGNOSIS — Z3A23 23 weeks gestation of pregnancy: Secondary | ICD-10-CM

## 2022-10-03 DIAGNOSIS — O365990 Maternal care for other known or suspected poor fetal growth, unspecified trimester, not applicable or unspecified: Secondary | ICD-10-CM

## 2022-10-03 DIAGNOSIS — O365920 Maternal care for other known or suspected poor fetal growth, second trimester, not applicable or unspecified: Secondary | ICD-10-CM

## 2022-10-03 DIAGNOSIS — O10919 Unspecified pre-existing hypertension complicating pregnancy, unspecified trimester: Secondary | ICD-10-CM

## 2022-10-03 DIAGNOSIS — O10912 Unspecified pre-existing hypertension complicating pregnancy, second trimester: Secondary | ICD-10-CM

## 2022-10-03 DIAGNOSIS — O0992 Supervision of high risk pregnancy, unspecified, second trimester: Secondary | ICD-10-CM

## 2022-10-03 LAB — POCT URINALYSIS DIPSTICK OB
Blood, UA: NEGATIVE
Glucose, UA: NEGATIVE
Ketones, UA: NEGATIVE
Leukocytes, UA: NEGATIVE
Nitrite, UA: NEGATIVE
POC,PROTEIN,UA: NEGATIVE

## 2022-10-03 MED ORDER — PRAVASTATIN SODIUM 40 MG PO TABS: 40.0000 mg | ORAL_TABLET | Freq: Every day | ORAL | 11 refills | Status: DC

## 2022-10-03 NOTE — Progress Notes (Signed)
HIGH-RISK PREGNANCY VISIT Patient name: Nancy Benjamin MRN [id number]  Date of birth: September 23, 1980 Chief Complaint:   Routine Prenatal Visit  History of Present Illness:   Nancy Benjamin is a 42 y.o. G23P0101 female at [redacted]w[redacted]d with an Estimated Date of Delivery: 01/25/23 being seen today for ongoing management of a high-risk pregnancy complicated by fetal growth restriction 1% with AEDF.    Today she reports no complaints. Contractions: Not present. Vag. Bleeding: None.  Movement: Present. denies leaking of fluid.      07/24/2022    4:00 PM 10/05/2021   11:26 AM  Depression screen PHQ 2/9  Decreased Interest 0 1  Down, Depressed, Hopeless 0 0  PHQ - 2 Score 0 1  Altered sleeping 0 1  Tired, decreased energy 1 1  Change in appetite 0 0  Feeling bad or failure about yourself  0 0  Trouble concentrating 0 0  Moving slowly or fidgety/restless 0 0  Suicidal thoughts 0 0  PHQ-9 Score 1 3        07/24/2022    4:00 PM 10/05/2021   11:26 AM  GAD 7 : Generalized Anxiety Score  Nervous, Anxious, on Edge 0 0  Control/stop worrying 0 0  Worry too much - different things 0 1  Trouble relaxing 0 0  Restless 0 0  Easily annoyed or irritable 0 1  Afraid - awful might happen 0 0  Total GAD 7 Score 0 2     Review of Systems:   Pertinent items are noted in HPI Denies abnormal vaginal discharge w/ itching/odor/irritation, headaches, visual changes, shortness of breath, chest pain, abdominal pain, severe nausea/vomiting, or problems with urination or bowel movements unless otherwise stated above. Pertinent History Reviewed:  Reviewed past medical,surgical, social, obstetrical and family history.  Reviewed problem list, medications and allergies. Physical Assessment:   Vitals:   10/03/22 1607 10/03/22 1622  BP: (!) 143/93 (!) 144/100  Pulse: 82 87  Weight: 170 lb (77.1 kg)   Body mass index is 33.2 kg/m.           Physical Examination:   General appearance: alert, well appearing, and in  no distress  Mental status: alert, oriented to person, place, and time  Skin: warm & dry   Extremities:      Cardiovascular: normal heart rate noted  Respiratory: normal respiratory effort, no distress  Abdomen: gravid, soft, non-tender  Pelvic: Cervical exam deferred         Fetal Status:     Movement: Present    Fetal Surveillance Testing today: sonogram yesterday MFM: read not done but I reviewed EFW <1%, AEDF   Chaperone: N/A    Results for orders placed or performed in visit on 10/03/22 (from the past 24 hour(s))  POC Urinalysis Dipstick OB   Collection Time: 10/03/22  4:27 PM  Result Value Ref Range   Color, UA     Clarity, UA     Glucose, UA Negative Negative   Bilirubin, UA     Ketones, UA neg    Spec Grav, UA     Blood, UA neg    pH, UA     POC,PROTEIN,UA Negative Negative, Trace, Small (1+), Moderate (2+), Large (3+), 4+   Urobilinogen, UA     Nitrite, UA neg    Leukocytes, UA Negative Negative   Appearance     Odor      Assessment & Plan:  High-risk pregnancy: G2P0101 at [redacted]w[redacted]d with an Estimated Date of  Delivery: 01/25/23      ICD-10-CM   1. Supervision of high risk pregnancy in second trimester  O09.92     2. [redacted] weeks gestation of pregnancy  Z3A.23 POC Urinalysis Dipstick OB    3. Chronic hypertension affecting pregnancy  O10.919 POC Urinalysis Dipstick OB   labetalol 600 TID    4. Fetal growth restriction antepartum: <1% with AEDF: Begin L arginine 2000 mg 3 times a day + pravastatin 40 mg daily  O36.5990        Meds:  Meds ordered this encounter  Medications   pravastatin (PRAVACHOL) 40 MG tablet    Sig: Take 1 tablet (40 mg total) by mouth daily.    Dispense:  30 tablet    Refill:  11    Orders:  Orders Placed This Encounter  Procedures   POC Urinalysis Dipstick OB     Labs/procedures today: U/S  Treatment Plan:  begin L arginine 2 grams TID + pravastatin   Follow-up: Return in about 1 week (around 10/10/2022) for HROB, keep  scheduled.   Future Appointments  Date Time Provider [place]  10/10/2022  1:15 PM [facility] NURSE [facility]  10/10/2022  1:30 PM [facility] US2 [facility]  10/11/2022  4:10 PM Janyth Pupa, DO [facility]  10/16/2022 11:30 AM [facility] [place] [facility] None  10/16/2022  1:30 PM Roma Schanz, CNM [facility]  10/23/2022 10:00 AM [facility] [place] [facility] None  10/23/2022 11:10 AM Janyth Pupa, DO [facility]  10/30/2022  8:30 AM [facility] [place] [facility] None  10/30/2022  9:30 AM Chancy Milroy, MD [facility]  11/06/2022  8:30 AM [facility] [place] [facility] None  11/06/2022  9:30 AM Florian Buff, MD [facility]  11/13/2022  8:30 AM [facility] [place] [facility] None  11/13/2022  9:30 AM Roma Schanz, CNM [facility]  11/27/2022  8:30 AM [facility] [place] [facility] None  12/04/2022  8:30 AM [facility] [place] [facility] None  12/11/2022  8:30 AM [facility] [place] [facility] None  12/18/2022  8:30 AM [facility] [place] [facility] None  12/25/2022  8:30 AM [facility] [place] [facility] None  01/01/2023  8:30 AM [facility] [place] [facility] None  01/08/2023  8:30 AM [facility] [place] [facility] None  01/15/2023  8:30 AM [facility] [place] [facility] None    Orders Placed This Encounter  Procedures   POC Urinalysis Dipstick OB   Florian Buff  Attending Physician for the Center for [facility] 10/03/2022 5:07 PM

## 2022-10-03 NOTE — Progress Notes (Signed)
MFM Note  Nancy Benjamin was seen due to IUGR with absent end-diastolic flow on umbilical artery Doppler studies that was noted on a recent ultrasound exam performed in your office.    The patient's [facility] of Jan 25, 2023 was confirmed via a first trimester ultrasound.    Her pregnancy has also been complicated by advanced maternal age (42 years old) and chronic hypertension treated with labetalol 600 mg 3 times a day.    Her prior pregnancy was complicated by an IUGR fetus requiring a preterm delivery at 32+ weeks due to severe preeclampsia.    Her baseline PIH labs and P/C ratio performed earlier in her current pregnancy were within normal limits.  She had a cell free DNA test performed earlier in her pregnancy showing a low risk for trisomy 77, 18, and 13.  A female fetus is predicted.  However, she also had an Integrated-2 test performed in your office that indicated an elevated MSAFP of 4.27 MoM.  Her Down syndrome risk based on the integrated-2 test was 1:6.  The nuchal translucency in the first trimester of 1.8 mm was within normal limits.  On today's exam, the EFW measures at <1st percentile for her gestational age indicating severe fetal growth restriction.    There was normal amniotic fluid. Fetal movements were noted throughout today's exam.  Doppler studies of the umbilical arteries showed absent end-diastolic flow.  There were no signs of reversed end-diastolic flow.  The views of the fetal anatomy were limited today due to the small fetal size.  There were no obvious anomalies noted on today's exam.  The following were discussed during today's consultation:  IUGR with absent end-diastolic flow  The implications and management of fetal growth restriction was discussed with the patient.    She was advised regarding the increased risk of an adverse pregnancy outcome such as stillbirth associated with IUGR, especially if the umbilical artery Doppler studies are abnormal.    She  was advised that due to her elevated MSAFP and absent end-diastolic flow noted today, that placental dysfunction is the most likely cause of IUGR.    Due to the increased risk of early onset severe preeclampsia in pregnancies complicated by placental dysfunction, she was advised to take 2 tablets of baby aspirin daily (81 mg each) for preeclampsia prophylaxis.    Fetal kick count instructions were reviewed.  She was advised that there is a high likelihood that she will require an indicated preterm birth especially if reversed end-diastolic flow is noted on her future exams.  Based on the low EFW of 276 g obtained today,her fetus is considered not viable at this time.  She understands that viability is usually considered once the fetus reaches 400 to 500 g or 23 to 24 weeks or greater.  We will try to delay delivery until she reaches a more optimal gestational age for delivery.  However, an IUFD may occur prior to the fetus reaching viability.  We will continue to follow her with weekly umbilical artery Doppler studies and may consider twice weekly fetal testing or inpatient management once viability is reached.  Advanced maternal age in pregnancy with elevated Down syndrome risk of 1:6 based on her Integrated-2 screen  The increased risk of Down syndrome based on her age and her integrated-2 screen was discussed.    She was offered and declined an amniocentesis today for definitive diagnosis of fetal chromosomal abnormalities.  She was reassured by the low risk for Down syndrome indicated by  her cell free DNA test.  Elevated MSAFP of 4.27 MoM  The patient was advised of the ultrasound findings that failed to reveal an anatomical cause for the increased MSAFP.  There were no sonographic signs of spina bifida or an anterior abdominal wall defect noted today.  She was advised that based on early onset IUGR with abnormal umbilical artery Doppler studies, that most likely placental dysfunction is  the cause of the elevated MSAFP.  The patient will return to our office in 1 week for another umbilical artery Doppler study and amniotic fluid assessment.  She understands that we will try to do everything possible help her achieve the best outcome in her pregnancy.  However due to early onset IUGR with abnormal umbilical artery Doppler studies, this may not be a successful pregnancy.  At the end of the consultation, the patient stated that all of her questions have been answered.    A total of 45 minutes was spent counseling and coordinating the care for this patient.  Greater than 50% of the time was spent in direct face-to-face contact.

## 2022-10-05 ENCOUNTER — Other Ambulatory Visit: Payer: Self-pay | Admitting: Obstetrics & Gynecology

## 2022-10-05 DIAGNOSIS — O365920 Maternal care for other known or suspected poor fetal growth, second trimester, not applicable or unspecified: Secondary | ICD-10-CM

## 2022-10-10 ENCOUNTER — Other Ambulatory Visit: Payer: Self-pay | Admitting: Obstetrics & Gynecology

## 2022-10-10 ENCOUNTER — Ambulatory Visit: Payer: 59 | Attending: Obstetrics & Gynecology

## 2022-10-10 ENCOUNTER — Ambulatory Visit: Payer: 59

## 2022-10-10 VITALS — BP 134/87 | HR 82

## 2022-10-10 DIAGNOSIS — O281 Abnormal biochemical finding on antenatal screening of mother: Secondary | ICD-10-CM | POA: Diagnosis not present

## 2022-10-10 DIAGNOSIS — O365990 Maternal care for other known or suspected poor fetal growth, unspecified trimester, not applicable or unspecified: Secondary | ICD-10-CM

## 2022-10-10 DIAGNOSIS — O10012 Pre-existing essential hypertension complicating pregnancy, second trimester: Secondary | ICD-10-CM | POA: Diagnosis not present

## 2022-10-10 DIAGNOSIS — O365920 Maternal care for other known or suspected poor fetal growth, second trimester, not applicable or unspecified: Secondary | ICD-10-CM

## 2022-10-10 DIAGNOSIS — Z8632 Personal history of gestational diabetes: Secondary | ICD-10-CM

## 2022-10-10 DIAGNOSIS — O09212 Supervision of pregnancy with history of pre-term labor, second trimester: Secondary | ICD-10-CM

## 2022-10-10 DIAGNOSIS — O0992 Supervision of high risk pregnancy, unspecified, second trimester: Secondary | ICD-10-CM

## 2022-10-10 DIAGNOSIS — O09522 Supervision of elderly multigravida, second trimester: Secondary | ICD-10-CM | POA: Diagnosis not present

## 2022-10-10 DIAGNOSIS — O34219 Maternal care for unspecified type scar from previous cesarean delivery: Secondary | ICD-10-CM

## 2022-10-10 DIAGNOSIS — Z3A24 24 weeks gestation of pregnancy: Secondary | ICD-10-CM

## 2022-10-10 DIAGNOSIS — O09292 Supervision of pregnancy with other poor reproductive or obstetric history, second trimester: Secondary | ICD-10-CM

## 2022-10-11 ENCOUNTER — Encounter: Payer: Self-pay | Admitting: Obstetrics & Gynecology

## 2022-10-11 ENCOUNTER — Ambulatory Visit (INDEPENDENT_AMBULATORY_CARE_PROVIDER_SITE_OTHER): Payer: 59 | Admitting: Obstetrics & Gynecology

## 2022-10-11 VITALS — BP 148/100 | HR 81 | Wt 169.4 lb

## 2022-10-11 DIAGNOSIS — O10919 Unspecified pre-existing hypertension complicating pregnancy, unspecified trimester: Secondary | ICD-10-CM

## 2022-10-11 DIAGNOSIS — Z98891 History of uterine scar from previous surgery: Secondary | ICD-10-CM

## 2022-10-11 DIAGNOSIS — O365990 Maternal care for other known or suspected poor fetal growth, unspecified trimester, not applicable or unspecified: Secondary | ICD-10-CM | POA: Insufficient documentation

## 2022-10-11 DIAGNOSIS — O10912 Unspecified pre-existing hypertension complicating pregnancy, second trimester: Secondary | ICD-10-CM

## 2022-10-11 DIAGNOSIS — O0993 Supervision of high risk pregnancy, unspecified, third trimester: Secondary | ICD-10-CM

## 2022-10-11 DIAGNOSIS — Z3A24 24 weeks gestation of pregnancy: Secondary | ICD-10-CM

## 2022-10-11 DIAGNOSIS — O365920 Maternal care for other known or suspected poor fetal growth, second trimester, not applicable or unspecified: Secondary | ICD-10-CM

## 2022-10-11 DIAGNOSIS — O0992 Supervision of high risk pregnancy, unspecified, second trimester: Secondary | ICD-10-CM

## 2022-10-11 NOTE — Progress Notes (Signed)
HIGH-RISK PREGNANCY VISIT Patient name: Nancy Benjamin MRN [id number]  Date of birth: 08-Nov-1980 Chief Complaint:   Routine Prenatal Visit  History of Present Illness:   Nancy Benjamin is a 42 y.o. G68P0101 female at 63w6dwith an Estimated Date of Delivery: 01/25/23 being seen today for ongoing management of a high-risk pregnancy complicated by:  Early severe FGR with absent EDF Chronic HTN- currently on Labetalol 6045mTID H/o C-section  plan for repeat with BTL  Today she reports no complaints. Seen yesterday by Dr. FaAnnamaria Bootsdoppler unchanged  Contractions: Not present. Vag. Bleeding: None.  Movement: Present. denies leaking of fluid.      07/24/2022    4:00 PM 10/05/2021   11:26 AM  Depression screen PHQ 2/9  Decreased Interest 0 1  Down, Depressed, Hopeless 0 0  PHQ - 2 Score 0 1  Altered sleeping 0 1  Tired, decreased energy 1 1  Change in appetite 0 0  Feeling bad or failure about yourself  0 0  Trouble concentrating 0 0  Moving slowly or fidgety/restless 0 0  Suicidal thoughts 0 0  PHQ-9 Score 1 3     Current Outpatient Medications  Medication Instructions   amoxicillin-clavulanate (AUGMENTIN) 875-125 MG tablet 1 tablet, Oral, 2 times daily   arginine 200 mg, Oral, 3 times daily   aspirin EC 162 mg, Oral, Daily   cetirizine (ZYRTEC ALLERGY) 10 MG tablet No dose, route, or frequency recorded.   cholecalciferol (VITAMIN D3) 2,000 Units, Oral, Daily   cyclobenzaprine (FLEXERIL) 10 mg, Oral, Every 8 hours PRN   guaiFENesin (MUCINEX) 600 mg, 2 times daily   hydrOXYzine (ATARAX) 25 mg, Oral, At bedtime PRN   labetalol (NORMODYNE) 600 mg, Oral, 3 times daily   omeprazole (PRILOSEC OTC) 20 mg, Oral, Daily   pravastatin (PRAVACHOL) 40 mg, Oral, Daily   Prenatal Vit-Fe Sulfate-FA-DHA (PRENATAL VITAMIN/MIN +DHA PO) No dose, route, or frequency recorded.   sertraline (ZOLOFT) 25 mg, Oral, Daily     Review of Systems:   Pertinent items are noted in HPI Denies abnormal  vaginal discharge w/ itching/odor/irritation, headaches, visual changes, shortness of breath, chest pain, abdominal pain, severe nausea/vomiting, or problems with urination or bowel movements unless otherwise stated above. Pertinent History Reviewed:  Reviewed past medical,surgical, social, obstetrical and family history.  Reviewed problem list, medications and allergies. Physical Assessment:   Vitals:   10/11/22 1614 10/11/22 1617  BP: (!) 159/102 (!) 148/100  Pulse: 80 81  Weight: 169 lb 6.4 oz (76.8 kg)   Body mass index is 33.08 kg/m.           Physical Examination:   General appearance: alert, well appearing, and in no distress  Mental status: normal mood, behavior, speech, dress, motor activity, and thought processes  Skin: warm & dry   Extremities: Edema: Trace    Cardiovascular: normal heart rate noted  Respiratory: normal respiratory effort, no distress  Abdomen: gravid, soft, non-tender  Pelvic: Cervical exam deferred         Fetal Status: Fetal Heart Rate (bpm): 130   Movement: Present    Fetal Surveillance Testing today: doppler   Chaperone: N/A    No results found for this or any previous visit (from the past 24 hour(s)).   Assessment & Plan:  High-risk pregnancy: G2P0101 at 2447w6dth an Estimated Date of Delivery: 01/25/23   1) Severe FGR with AEDF -continue with weekly BPP/doppler -likely plan for hospital admission once viability or around 28wks -due to steroid  at time of admission, plan for early glucola at 26wks  2) Chronic HTN  -BP at home 130/80s Notes increased anxiety during visits in our office.  Normal BP with MFM yesterday -continue current meds  3)Prior C-section -plan for repeat Csection with BTL  Meds: No orders of the defined types were placed in this encounter.   Labs/procedures today: doppler  Treatment Plan:  as outlined above  Reviewed: Preterm labor symptoms and general obstetric precautions including but not limited to vaginal  bleeding, contractions, leaking of fluid and fetal movement were reviewed in detail with the patient.  All questions were answered. Pt has home bp cuff. Check bp weekly, let us know if >160/110.   Follow-up: Return in about 1 week (around 10/18/2022) for [place] visit MD Only- weekly as scheduled.   Future Appointments  Date Time Provider [place]  10/16/2022 11:30 AM [facility] [place] [facility] None  10/16/2022  1:30 PM Roma Schanz, CNM [facility]  10/23/2022 10:00 AM [place] - [facility] [place] [facility] None  10/23/2022 11:10 AM Janyth Pupa, DO [facility]  10/30/2022  8:30 AM [facility] [place] [facility] None  10/30/2022  9:30 AM Chancy Milroy, MD [facility]  11/06/2022  8:30 AM [place] - [facility]TO[facility] [place] [facility] None  11/06/2022  9:30 AM Florian Buff, MD [facility]  11/13/2022  8:30 AM [facility] [place] [facility] None  11/13/2022  9:30 AM Roma Schanz, CNM [facility]  11/27/2022  8:30 AM [facility] [place] [facility] None  12/04/2022  8:30 AM [facility] [place] [facility] None  12/11/2022  8:30 AM [facility] [place] [facility] None  12/18/2022  8:30 AM [facility] [place] [facility] None  12/25/2022  8:30 AM [facility] [place] [facility] None  01/01/2023  8:30 AM [facility] [place] [facility] None  01/08/2023  8:30 AM [facility] [place] [facility] None  01/15/2023  8:30 AM [facility] [place] [facility] None    No orders of the defined types were placed in this encounter.   Janyth Pupa, DO Attending [place] [employer] [facility], [place]

## 2022-10-13 ENCOUNTER — Encounter: Payer: Self-pay | Admitting: *Deleted

## 2022-10-16 ENCOUNTER — Encounter: Payer: Self-pay | Admitting: Advanced Practice Midwife

## 2022-10-16 ENCOUNTER — Ambulatory Visit (INDEPENDENT_AMBULATORY_CARE_PROVIDER_SITE_OTHER): Payer: 59

## 2022-10-16 ENCOUNTER — Inpatient Hospital Stay (HOSPITAL_COMMUNITY)
Admission: AD | Admit: 2022-10-16 | Discharge: 2022-10-25 | DRG: 806 | Disposition: A | Discharge status: Home / Self Care | Payer: 59 | Attending: Obstetrics and Gynecology | Admitting: Obstetrics and Gynecology

## 2022-10-16 ENCOUNTER — Other Ambulatory Visit: Payer: Self-pay

## 2022-10-16 ENCOUNTER — Ambulatory Visit (INDEPENDENT_AMBULATORY_CARE_PROVIDER_SITE_OTHER): Payer: 59 | Admitting: Advanced Practice Midwife

## 2022-10-16 VITALS — BP 129/87 | HR 86 | Wt 170.0 lb

## 2022-10-16 DIAGNOSIS — O364XX0 Maternal care for intrauterine death, not applicable or unspecified: Secondary | ICD-10-CM | POA: Diagnosis present

## 2022-10-16 DIAGNOSIS — O99344 Other mental disorders complicating childbirth: Secondary | ICD-10-CM | POA: Diagnosis present

## 2022-10-16 DIAGNOSIS — G4701 Insomnia due to medical condition: Secondary | ICD-10-CM | POA: Diagnosis not present

## 2022-10-16 DIAGNOSIS — O365920 Maternal care for other known or suspected poor fetal growth, second trimester, not applicable or unspecified: Secondary | ICD-10-CM

## 2022-10-16 DIAGNOSIS — O0992 Supervision of high risk pregnancy, unspecified, second trimester: Secondary | ICD-10-CM

## 2022-10-16 DIAGNOSIS — O10912 Unspecified pre-existing hypertension complicating pregnancy, second trimester: Secondary | ICD-10-CM

## 2022-10-16 DIAGNOSIS — O34219 Maternal care for unspecified type scar from previous cesarean delivery: Secondary | ICD-10-CM | POA: Diagnosis present

## 2022-10-16 DIAGNOSIS — O09522 Supervision of elderly multigravida, second trimester: Secondary | ICD-10-CM

## 2022-10-16 DIAGNOSIS — Z3A26 26 weeks gestation of pregnancy: Secondary | ICD-10-CM | POA: Diagnosis not present

## 2022-10-16 DIAGNOSIS — O09292 Supervision of pregnancy with other poor reproductive or obstetric history, second trimester: Secondary | ICD-10-CM | POA: Diagnosis not present

## 2022-10-16 DIAGNOSIS — O365990 Maternal care for other known or suspected poor fetal growth, unspecified trimester, not applicable or unspecified: Secondary | ICD-10-CM | POA: Diagnosis not present

## 2022-10-16 DIAGNOSIS — O365930 Maternal care for other known or suspected poor fetal growth, third trimester, not applicable or unspecified: Secondary | ICD-10-CM | POA: Diagnosis not present

## 2022-10-16 DIAGNOSIS — Z3A25 25 weeks gestation of pregnancy: Secondary | ICD-10-CM

## 2022-10-16 DIAGNOSIS — O09299 Supervision of pregnancy with other poor reproductive or obstetric history, unspecified trimester: Secondary | ICD-10-CM

## 2022-10-16 DIAGNOSIS — O3508X0 Maternal care for (suspected) central nervous system malformation or damage in fetus, spina bifida, not applicable or unspecified: Secondary | ICD-10-CM | POA: Diagnosis not present

## 2022-10-16 DIAGNOSIS — O1002 Pre-existing essential hypertension complicating childbirth: Secondary | ICD-10-CM | POA: Diagnosis not present

## 2022-10-16 DIAGNOSIS — O34211 Maternal care for low transverse scar from previous cesarean delivery: Secondary | ICD-10-CM | POA: Diagnosis not present

## 2022-10-16 DIAGNOSIS — Z8632 Personal history of gestational diabetes: Secondary | ICD-10-CM | POA: Diagnosis not present

## 2022-10-16 DIAGNOSIS — Z87891 Personal history of nicotine dependence: Secondary | ICD-10-CM | POA: Diagnosis not present

## 2022-10-16 DIAGNOSIS — O1424 HELLP syndrome, complicating childbirth: Secondary | ICD-10-CM | POA: Diagnosis present

## 2022-10-16 DIAGNOSIS — O09212 Supervision of pregnancy with history of pre-term labor, second trimester: Secondary | ICD-10-CM | POA: Diagnosis not present

## 2022-10-16 DIAGNOSIS — Z98891 History of uterine scar from previous surgery: Secondary | ICD-10-CM

## 2022-10-16 DIAGNOSIS — F419 Anxiety disorder, unspecified: Secondary | ICD-10-CM | POA: Diagnosis present

## 2022-10-16 DIAGNOSIS — O3513X0 Maternal care for (suspected) chromosomal abnormality in fetus, trisomy 21, not applicable or unspecified: Secondary | ICD-10-CM | POA: Diagnosis not present

## 2022-10-16 DIAGNOSIS — O10012 Pre-existing essential hypertension complicating pregnancy, second trimester: Secondary | ICD-10-CM | POA: Diagnosis not present

## 2022-10-16 DIAGNOSIS — F4321 Adjustment disorder with depressed mood: Secondary | ICD-10-CM | POA: Diagnosis not present

## 2022-10-16 DIAGNOSIS — Z79899 Other long term (current) drug therapy: Secondary | ICD-10-CM

## 2022-10-16 DIAGNOSIS — O09523 Supervision of elderly multigravida, third trimester: Secondary | ICD-10-CM | POA: Diagnosis not present

## 2022-10-16 DIAGNOSIS — O10919 Unspecified pre-existing hypertension complicating pregnancy, unspecified trimester: Secondary | ICD-10-CM | POA: Diagnosis present

## 2022-10-16 DIAGNOSIS — O0990 Supervision of high risk pregnancy, unspecified, unspecified trimester: Secondary | ICD-10-CM

## 2022-10-16 DIAGNOSIS — O368320 Maternal care for abnormalities of the fetal heart rate or rhythm, second trimester, not applicable or unspecified: Secondary | ICD-10-CM | POA: Diagnosis not present

## 2022-10-16 LAB — COMPREHENSIVE METABOLIC PANEL
ALT: 18 U/L (ref 0–44)
AST: 32 U/L (ref 15–41)
Albumin: 3.1 g/dL — ABNORMAL LOW (ref 3.5–5.0)
Alkaline Phosphatase: 80 U/L (ref 38–126)
Anion gap: 10 (ref 5–15)
BUN: 6 mg/dL (ref 6–20)
CO2: 22 mmol/L (ref 22–32)
Calcium: 9.6 mg/dL (ref 8.9–10.3)
Chloride: 103 mmol/L (ref 98–111)
Creatinine, Ser: 0.92 mg/dL (ref 0.44–1.00)
GFR, Estimated: >60 mL/min (ref >60)
Glucose, Bld: 213 mg/dL — ABNORMAL HIGH (ref 70–99)
Potassium: 3.9 mmol/L (ref 3.5–5.1)
Sodium: 135 mmol/L (ref 135–145)
Total Bilirubin: 0.5 mg/dL (ref 0.3–1.2)
Total Protein: 6.3 g/dL — ABNORMAL LOW (ref 6.5–8.1)

## 2022-10-16 LAB — CBC
HCT: 38.5 % (ref 36.0–46.0)
Hemoglobin: 13.2 g/dL (ref 12.0–15.0)
MCH: 32.7 pg (ref 26.0–34.0)
MCHC: 34.3 g/dL (ref 30.0–36.0)
MCV: 95.3 fL (ref 80.0–100.0)
Platelets: 253 10*3/uL (ref 150–400)
RBC: 4.04 MIL/uL (ref 3.87–5.11)
RDW: 12.1 % (ref 11.5–15.5)
WBC: 11 10*3/uL — ABNORMAL HIGH (ref 4.0–10.5)
nRBC: 0 % (ref 0.0–0.2)

## 2022-10-16 LAB — PROTEIN / CREATININE RATIO, URINE
Creatinine, Urine: 18 mg/dL
Total Protein, Urine: <6 mg/dL

## 2022-10-16 LAB — HEMOGLOBIN A1C
Hgb A1c MFr Bld: 4.9 % (ref 4.8–5.6)
Mean Plasma Glucose: 93.93 mg/dL

## 2022-10-16 LAB — TYPE AND SCREEN
ABO/RH(D): O POS
Antibody Screen: NEGATIVE

## 2022-10-16 MED ORDER — LACTATED RINGERS IV SOLN: 125.0000 mL/h | INTRAVENOUS | Status: AC

## 2022-10-16 MED ORDER — SERTRALINE HCL 25 MG PO TABS
25.0000 mg | ORAL_TABLET | Freq: Every day | ORAL | Status: DC
  Administered 2022-10-17 – 2022-10-20 (×4): 25 mg via ORAL
  Filled 2022-10-16 (×6): qty 1

## 2022-10-16 MED ORDER — BETAMETHASONE SOD PHOS & ACET 6 (3-3) MG/ML IJ SUSP
12.0000 mg | INTRAMUSCULAR | Status: AC
  Administered 2022-10-16 – 2022-10-17 (×2): 12 mg via INTRAMUSCULAR
  Filled 2022-10-16: qty 5

## 2022-10-16 MED ORDER — LABETALOL HCL 200 MG PO TABS
600.0000 mg | ORAL_TABLET | Freq: Three times a day (TID) | ORAL | Status: DC
  Administered 2022-10-16 – 2022-10-25 (×20): 600 mg via ORAL
  Filled 2022-10-16 (×21): qty 3

## 2022-10-16 MED ORDER — ASPIRIN 81 MG PO TBEC
162.0000 mg | DELAYED_RELEASE_TABLET | Freq: Every day | ORAL | Status: DC
  Administered 2022-10-17 – 2022-10-20 (×4): 162 mg via ORAL
  Filled 2022-10-16 (×8): qty 2

## 2022-10-16 MED ORDER — OMEPRAZOLE MAGNESIUM 20 MG PO TBEC: 20.0000 mg | DELAYED_RELEASE_TABLET | Freq: Every day | ORAL | Status: DC

## 2022-10-16 MED ORDER — ACETAMINOPHEN 325 MG PO TABS: 650.0000 mg | ORAL_TABLET | ORAL | Status: DC | PRN

## 2022-10-16 MED ORDER — PANTOPRAZOLE SODIUM 40 MG PO TBEC
40.0000 mg | DELAYED_RELEASE_TABLET | Freq: Every day | ORAL | Status: DC
  Administered 2022-10-17 – 2022-10-20 (×4): 40 mg via ORAL
  Filled 2022-10-16 (×4): qty 1

## 2022-10-16 MED ORDER — CALCIUM CARBONATE ANTACID 500 MG PO CHEW: 2.0000 | CHEWABLE_TABLET | ORAL | Status: DC | PRN

## 2022-10-16 MED ORDER — DOCUSATE SODIUM 100 MG PO CAPS
100.0000 mg | ORAL_CAPSULE | Freq: Every day | ORAL | Status: DC
  Administered 2022-10-20: 100 mg via ORAL
  Filled 2022-10-16 (×4): qty 1

## 2022-10-16 MED ORDER — PRAVASTATIN SODIUM 40 MG PO TABS
40.0000 mg | ORAL_TABLET | Freq: Every day | ORAL | Status: DC
  Administered 2022-10-17 – 2022-10-20 (×4): 40 mg via ORAL
  Filled 2022-10-16 (×7): qty 1

## 2022-10-16 MED ORDER — PRENATAL MULTIVITAMIN CH
1.0000 | ORAL_TABLET | Freq: Every day | ORAL | Status: DC
  Administered 2022-10-17 – 2022-10-20 (×4): 1 via ORAL
  Filled 2022-10-16 (×4): qty 1

## 2022-10-16 NOTE — H&P (Addendum)
Nancy Benjamin is a 42 y.o. female G2 P0101 at 41w4dpresenting for inpatient observation of fetal growth restrictions. Patient was seen in the office and ultrasound revealed abnormal dopplers (reverse end diastolic flow). Patient reports feeling well. She is without any complaints. She reports good fetal movement. She denies contractions, leakage of fluid or vaginal bleeding. [facility] [place] [place] by AMA, CHTN, history of preeclampsia and FGR with previous pregnancy which resulted in a preterm delivery via c-section. Patient also has a history of GDM.  OB History     Gravida  2   Para  1   Term      Preterm  1   AB      Living  1      SAB      IAB      Ectopic      Multiple  0   Live Births  1          Past Medical History:  Diagnosis Date   Diabetes mellitus without complication ([place]    gestational   Hypertension    patient reports htn since "mid 20's"   Past Surgical History:  Procedure Laterality Date   CESAREAN SECTION N/A 05/01/2020   Procedure: CESAREAN SECTION;  Surgeon: EChancy Milroy MD;  Location: [facility];  Service: Obstetrics;  Laterality: N/A;   WISDOM TOOTH EXTRACTION     Family History: family history includes Asthma in her father; Diabetes in her mother; Heart attack in her maternal grandfather; Hypertension in her father; Irritable bowel syndrome in her mother; Prostate cancer in her father and paternal grandfather. Social History:  reports that she has quit smoking. Her smoking use included cigarettes. She has never used smokeless tobacco. She reports that she does not currently use alcohol. She reports that she does not use drugs.     Maternal Diabetes: No Genetic Screening: Normal Maternal Ultrasounds/Referrals: IUGR Fetal Ultrasounds or other Referrals:  None Maternal Substance Abuse:  No Significant Maternal Medications:  None Significant Maternal Lab Results:  None Number of  Prenatal Visits:greater than 3 verified prenatal visits Other Comments:  None  Review of Systems See pertinent in HPI. All other systems reviewed and non contributory History   Last menstrual period 04/20/2022. Exam Physical Exam  GENERAL: Well-developed, well-nourished female in no acute distress.  LUNGS: Clear to auscultation bilaterally.  HEART: Regular rate and rhythm. ABDOMEN: Soft, nontender, gravid PELVIC: Not indicated EXTREMITIES: No cyanosis, clubbing, or edema, 2+ distal pulses.  Prenatal labs: ABO, Rh: O/Positive/-- (11/27 1642) Antibody: Negative (11/27 1642) Rubella: 1.55 (11/27 1642) RPR: Non Reactive (11/27 1642)  HBsAg: Negative (11/27 1642)  HIV: Non Reactive (11/27 1642)  GBS:     Assessment/Plan: 42yo G2P0101 at 22w4dith CHTN, FGR and abnormal dopplers 1) FGR  - Will start course of BMZ - Plan for MFM ultrasound - Will delay magnesium for neuro protection close to delivery - patient with previous cesarean section with plans for repeat c-section and BTL  2) CHTN - Will continue labetalol at current dosage and adjust as needed  Close antepartum monitoring  Nancy Benjamin 10/16/2022, 4:40 PM

## 2022-10-16 NOTE — Progress Notes (Signed)
[place] 25+4 wks,transverse head left,FHR 126 bpm,anterior placenta gr 1,SVP of fluid 2 cm,AFI 7 cm,echogenic bowel,? cardiomegaly,elevated UAD with reverse EDF,discussed results with Manus Gunning

## 2022-10-16 NOTE — Progress Notes (Signed)
HIGH-RISK PREGNANCY VISIT Patient name: Nancy Benjamin MRN [id number]  Date of birth: 07-15-1981 Chief Complaint:   Routine Prenatal Visit  History of Present Illness:   Nancy Benjamin is a 42 y.o. G52P0101 female at 81w4dwith an Estimated Date of Delivery: 01/25/23 being seen today for ongoing management of a high-risk pregnancy complicated by:   Early severe FGR: ^MSAFP, low normal AFI NOW REVERSE EDF Chronic HTN- currently on Labetalol 6066mTID H/o C-section  plan for repeat with BTL.    . Marland Kitchen    07/24/2022    4:00 PM 10/05/2021   11:26 AM  Depression screen PHQ 2/9  Decreased Interest 0 1  Down, Depressed, Hopeless 0 0  PHQ - 2 Score 0 1  Altered sleeping 0 1  Tired, decreased energy 1 1  Change in appetite 0 0  Feeling bad or failure about yourself  0 0  Trouble concentrating 0 0  Moving slowly or fidgety/restless 0 0  Suicidal thoughts 0 0  PHQ-9 Score 1 3        07/24/2022    4:00 PM 10/05/2021   11:26 AM  GAD 7 : Generalized Anxiety Score  Nervous, Anxious, on Edge 0 0  Control/stop worrying 0 0  Worry too much - different things 0 1  Trouble relaxing 0 0  Restless 0 0  Easily annoyed or irritable 0 1  Afraid - awful might happen 0 0  Total GAD 7 Score 0 2     Review of Systems:   Pertinent items are noted in HPI Denies abnormal vaginal discharge w/ itching/odor/irritation, headaches, visual changes, shortness of breath, chest pain, abdominal pain, severe nausea/vomiting, or problems with urination or bowel movements unless otherwise stated above. Pertinent History Reviewed:  Reviewed past medical,surgical, social, obstetrical and family history.  Reviewed problem list, medications and allergies. Physical Assessment:   Vitals:   10/16/22 1228  BP: 129/87  Pulse: 86  Weight: 170 lb (77.1 kg)  Body mass index is 33.2 kg/m.           Physical Examination:   General appearance: alert, well appearing, and in no distress  Mental status: alert, oriented  to person, place, and time  Skin: warm & dry   Extremities: Edema: None    Cardiovascular: normal heart rate noted  Respiratory: normal respiratory effort, no distress  Abdomen: gravid, soft, non-tender  Pelvic: Cervical exam deferred         Fetal Status:     Movement: Present    Fetal Surveillance Testing today: US[place]5+4 wks,transverse head left,FHR 126 bpm,anterior placenta gr 1,SVP of fluid 2 cm,AFI 7 cm,echogenic bowel,? cardiomegaly,elevated UAD with reverse ED    Chaperone: N/A    No results found for this or any previous visit (from the past 24 hour(s)).  Assessment & Plan:  High-risk pregnancy: G2P0101 at 2566w4dth an Estimated Date of Delivery: 01/25/23   1. Fetal growth restriction antepartum Discussed w/Dr. Constant.  [facility]   Meds: No orders of the defined types were placed in this encounter.   Orders: No orders of the defined types were placed in this encounter.    Labs/procedures today: US [place]pplers       Future Appointments  Date Time Provider DepSchoeneck/19/2024  1:50 PM CreChristin FudgeNM [facility]TO[facility]YN  10/23/2022 10:00 AM [facility] - [facility] [place]FT[facility]G None  10/23/2022 11:10 AM OzaJanyth PupaO [facility] FTOBGYN  10/30/2022  8:30  AM [facility] [place] [facility] None  10/30/2022  9:30 AM Chancy Milroy, MD [facility]  11/06/2022  8:30 AM [facility] [place] [facility] None  11/06/2022  9:30 AM Florian Buff, MD [facility]  11/13/2022  8:30 AM [facility] [place] [facility] None  11/13/2022  9:30 AM Roma Schanz, CNM [facility]  11/27/2022  8:30 AM [facility] [place] [facility] None  12/04/2022  8:30 AM [facility] [place] [facility] None  12/11/2022  8:30 AM [facility] [place] [facility] None  12/18/2022  8:30 AM [facility] [place] [facility] None  12/25/2022  8:30 AM [facility] [place] [facility] None  01/01/2023  8:30 AM [facility] [place] [facility] None  01/08/2023  8:30 AM [facility] [place] [facility] None  01/15/2023  8:30 AM  [facility] [place] [facility] None    No orders of the defined types were placed in this encounter.  Christin Fudge , DNP, [facility] 10/16/2022 12:45 PM

## 2022-10-17 ENCOUNTER — Inpatient Hospital Stay (HOSPITAL_BASED_OUTPATIENT_CLINIC_OR_DEPARTMENT_OTHER): Payer: 59

## 2022-10-17 DIAGNOSIS — O10012 Pre-existing essential hypertension complicating pregnancy, second trimester: Secondary | ICD-10-CM

## 2022-10-17 DIAGNOSIS — O365920 Maternal care for other known or suspected poor fetal growth, second trimester, not applicable or unspecified: Secondary | ICD-10-CM

## 2022-10-17 DIAGNOSIS — O09212 Supervision of pregnancy with history of pre-term labor, second trimester: Secondary | ICD-10-CM

## 2022-10-17 DIAGNOSIS — O3513X0 Maternal care for (suspected) chromosomal abnormality in fetus, trisomy 21, not applicable or unspecified: Secondary | ICD-10-CM

## 2022-10-17 DIAGNOSIS — Z8632 Personal history of gestational diabetes: Secondary | ICD-10-CM

## 2022-10-17 DIAGNOSIS — O09522 Supervision of elderly multigravida, second trimester: Secondary | ICD-10-CM

## 2022-10-17 DIAGNOSIS — O09292 Supervision of pregnancy with other poor reproductive or obstetric history, second trimester: Secondary | ICD-10-CM

## 2022-10-17 DIAGNOSIS — Z3A25 25 weeks gestation of pregnancy: Secondary | ICD-10-CM

## 2022-10-17 DIAGNOSIS — O3508X0 Maternal care for (suspected) central nervous system malformation or damage in fetus, spina bifida, not applicable or unspecified: Secondary | ICD-10-CM

## 2022-10-17 DIAGNOSIS — O34219 Maternal care for unspecified type scar from previous cesarean delivery: Secondary | ICD-10-CM

## 2022-10-17 NOTE — Consult Note (Signed)
MFM Note  Nancy Benjamin is a gravida 2 para 1 currently at 25 weeks and 5 days.  She was seen in consultation at the request of Dr. Elly Modena due to severe IUGR with reversed end-diastolic flow noted on an ultrasound that was performed yesterday at [facility].    She has been followed in the MFM office for the past 2 weeks due to severe IUGR.  Doppler studies of the umbilical arteries over the last 2 weeks have shown absent end-diastolic flow.    The most recent EFW obtained 2 weeks ago was 276 g (10 ounces, less than the 1st percentile for her gestational age).  On admission, her [place] labs were all within normal limits.  Her blood pressures have varied from 140/100 to 120's/70's .  She denies any signs or symptoms of preeclampsia today.  Her pregnancy has also been complicated by advanced maternal age and chronic hypertension treated with labetalol 600 mg 3 times a day.    Her prior pregnancy was also complicated by an IUGR fetus requiring a preterm delivery at 32+ weeks due to severe preeclampsia.  She had a cell free DNA test performed earlier in her current pregnancy indicating a low risk for trisomy 28, 18, and 13.  A female fetus is predicted.  Her current pregnancy is also complicated by an elevated MSAFP level of 4.27 MoM. The patient has been advised that placental dysfunction is the most likely cause of IUGR.  Due to the increased risk of early onset severe preeclampsia associated with IUGR due to placental dysfunction, she has been taking 2 tablets of baby aspirin daily for preeclampsia prophylaxis.  On today's ultrasound exam, intermittent reversed end-diastolic flow is noted on her umbilical artery Doppler studies.  Absent end-diastolic flow is noted the majority of the time.  Low normal amniotic fluid with a maximal vertical pocket of 2.3 cm is noted.  Vigorous fetal movements were noted throughout today's exam.  The fetus was in the breech presentation today.  Due to severe IUGR  with intermittent absent end-diastolic flow, she should continue inpatient management with daily fetal testing until delivery.    The patient understands that viability is currently considered when the EFW is 400 g or greater.  The fact that she is 25 weeks and 5 days indicates a better prognosis for her baby should she require delivery in the near future.  She should receive a complete course of antenatal corticosteroids.    She should also receive magnesium sulfate for fetal neuroprotection for at least 4 hours (if possible) prior to delivery.  While hospitalized, she should continue taking 2 tablets of baby aspirin daily (81 mg each) for preeclampsia prophylaxis.  She should have twice weekly umbilical artery Doppler studies performed to determine if there is persistent reversed end-diastolic flow.  She should have a growth ultrasound performed early next week.  The patient understands that our goal for her pregnancy is for her to deliver at 28 weeks or greater.    Delivery will be indicated should there be persistent reversed end-diastolic flow noted on her umbilical artery Doppler studies, the overall EFW is 400 g or greater, and she is greater than 28 weeks.   Delivery will also be recommended at any time for nonreassuring fetal status.    The patient understands that should the EFW be less than 400 g at the time of delivery, there is a possibility that her baby may not be able to be resuscitated by the NICU team.  She should have a NICU consult scheduled.    She should receive a rescue course of steroids should she require delivery and it has been 7 days or greater since she received the initial course.  The patient has stated that she will have a tubal ligation performed at the time of her cesarean delivery.  Due to the smaller fetal size, a classical uterine incision is recommended in order to deliver the fetus in the most atraumatic fashion.  We will continue to follow her  closely with you.  The patient stated that all of her questions were answered today.

## 2022-10-17 NOTE — Progress Notes (Signed)
Patient ID: Nancy Benjamin, female   DOB: 1981-07-10, 42 y.o.   MRN: [id number] Nancy Benjamin) NOTE  Nancy Benjamin is a 42 y.o. G2P0101 at [redacted]w[redacted]d who is admitted for inpatient monitoring of fetal growth restrictions with abnormal dopplers.    Fetal presentation is unsure. Length of Stay:  1  Days  Date of admission:10/16/2022  Subjective: Patient reports feeling well and is without complaints. She denies contractions, vaginal bleeding or leakage of fluid. She reports fetal movement   Vitals:  Blood pressure (!) 151/94, pulse 75, temperature 98.2 F (36.8 C), temperature source Oral, resp. rate 20, height 5' (1.524 m), weight 77.6 kg, last menstrual period 04/20/2022, SpO2 100 %. Vitals:   10/16/22 1924 10/17/22 0005 10/17/22 0011 10/17/22 0406  BP: 138/88  136/83 (!) 151/94  Pulse: 67  80 75  Resp: 18  17 20  $ Temp: 98 F (36.7 C)  98 F (36.7 C) 98.2 F (36.8 C)  TempSrc: Oral  Oral Oral  SpO2: 98% 97% 98% 100%  Weight:      Height:       Physical Examination: GENERAL: Well-developed, well-nourished female in no acute distress.   LUNGS: Clear to auscultation bilaterally.  HEART: Regular rate and rhythm. ABDOMEN: Soft, nontender, nondistended. No organomegaly. PELVIC: Not indicated EXTREMITIES: No cyanosis, clubbing, or edema, 2+ distal pulses.   Fetal Monitoring: baseline Baseline 125, min variability, no accels, no decels Toco: no contractions  Labs:  Results for orders placed or performed during the hospital encounter of 10/16/22 (from the past 24 hour(s))  Type and screen [place]  Collection Time: 10/16/22  5:38 PM  Result Value Ref Range   ABO/RH(D) O POS    Antibody Screen NEG    Sample Expiration      10/19/2022,2359 Performed at [facility] [id number], [place] [address]  Hemoglobin A1c   Collection Time: 10/16/22  5:42 PM  Result Value Ref Range   Hgb A1c MFr Bld 4.9 4.8 - 5.6 %   Mean  Plasma Glucose 93.93 mg/dL  CBC   Collection Time: 10/16/22  5:42 PM  Result Value Ref Range   WBC 11.0 (H) 4.0 - 10.5 K/uL   RBC 4.04 3.87 - 5.11 MIL/uL   Hemoglobin 13.2 12.0 - 15.0 g/dL   HCT 38.5 36.0 - 46.0 %   MCV 95.3 80.0 - 100.0 fL   MCH 32.7 26.0 - 34.0 pg   MCHC 34.3 30.0 - 36.0 g/dL   RDW 12.1 11.5 - 15.5 %   Platelets 253 150 - 400 K/uL   nRBC 0.0 0.0 - 0.2 %  Protein / creatinine ratio, urine   Collection Time: 10/16/22  5:50 PM  Result Value Ref Range   Creatinine, Urine 18 mg/dL   Total Protein, Urine <6 mg/dL   Protein Creatinine Ratio        0.00 - 0.15 mg/mg[Cre]  Comprehensive metabolic panel   Collection Time: 10/16/22  9:02 PM  Result Value Ref Range   Sodium 135 135 - 145 mmol/L   Potassium 3.9 3.5 - 5.1 mmol/L   Chloride 103 98 - 111 mmol/L   CO2 22 22 - 32 mmol/L   Glucose, Bld 213 (H) 70 - 99 mg/dL   BUN 6 6 - 20 mg/dL   Creatinine, Ser 0.92 0.44 - 1.00 mg/dL   Calcium 9.6 8.9 - 10.3 mg/dL   Total Protein 6.3 (L) 6.5 - 8.1 g/dL   Albumin  3.1 (L) 3.5 - 5.0 g/dL   AST 32 15 - 41 U/L   ALT 18 0 - 44 U/L   Alkaline Phosphatase 80 38 - 126 U/L   Total Bilirubin 0.5 0.3 - 1.2 mg/dL   GFR, Estimated >60 >60 mL/min   Anion gap 10 5 - 15    Imaging Studies:    No results found.   Medications:  Scheduled  aspirin EC  162 mg Oral Daily   betamethasone acetate-betamethasone sodium phosphate  12 mg Intramuscular Q24H   docusate sodium  100 mg Oral Daily   labetalol  600 mg Oral TID   pantoprazole  40 mg Oral Daily   pravastatin  40 mg Oral Daily   prenatal multivitamin  1 tablet Oral Q1200   sertraline  25 mg Oral Daily   I have reviewed the patient's current medications.  ASSESSMENT:  Patient Active Problem List   Diagnosis Date Noted   Fetal growth restriction antepartum 10/11/2022   Chronic hypertension affecting pregnancy 06/20/2022   History of prior pregnancy with IUGR newborn 06/20/2022   Hx of preeclampsia, prior pregnancy, currently  pregnant 06/20/2022   History of cesarean delivery 06/19/2022   History of gestational diabetes in prior pregnancy, currently pregnant 06/19/2022   Supervision of high-risk pregnancy 06/19/2022    PLAN: 42 yo at 53w5dwith CHTN and fetal growth restrictions  1) FGR - Patient to complete second dose of BMZ today - Follow up ultrasound and MFM recommendations - Will plan for NICU consult following ultrasound report  2) CHTN - Continue labetalol - Continue ASA  Continue routine care  Nancy Benjamin 10/17/2022,11:22 AM

## 2022-10-17 NOTE — Inpatient Diabetes Management (Signed)
Inpatient Diabetes Program Recommendations  ADA Standards of Care 2024 Diabetes in Pregnancy Target Glucose Ranges:  Fasting: 70 - 95 mg/dL 1 hr postprandial:  110 - 182m/dL (from first bite of meal) 2 hr postprandial:  100 - 120 mg/dL (from first bit of meal)    Lab Results  Component Value Date   GLUCAP 72 05/01/2020   HGBA1C 4.9 10/16/2022    Review of Glycemic Control  Latest Reference Range & Units 10/16/22 21:02  Glucose 70 - 99 mg/dL 213 (H)   Diabetes history: GDM Outpatient Diabetes medications: None Current orders for Inpatient glycemic control: None  BMZ x 1 dose at 1820 on 2/19, scheduled for one more today.  Note glucose was 213 after betamethasone dose  Inpatient Diabetes Program Recommendations:    -  Start one fasting glucose at 0600 and postprandial CBGs tid with Novolog Correction scale 0-16 units per order set  Thanks,  STama HeadingsRN, MSN, BC-ADM Inpatient Diabetes Coordinator Team Pager [phone]-[phone])

## 2022-10-18 DIAGNOSIS — O365920 Maternal care for other known or suspected poor fetal growth, second trimester, not applicable or unspecified: Secondary | ICD-10-CM | POA: Diagnosis not present

## 2022-10-18 DIAGNOSIS — Z3A26 26 weeks gestation of pregnancy: Secondary | ICD-10-CM

## 2022-10-18 NOTE — Progress Notes (Signed)
Patient ID: Nancy Benjamin, female   DOB: March 05, 1981, 42 y.o.   MRN: [id number] [place]) NOTE  Nancy Benjamin is a 42 y.o. G2P0101 at [redacted]w[redacted]d who is admitted for inpatient monitoring of fetal growth restrictions with abnormal dopplers.    Fetal presentation is unsure. Length of Stay:  2  Days  Date of admission:10/16/2022  Subjective: Patient reports feeling well and is without complaints. She denies contractions, vaginal bleeding or leakage of fluid. She reports fetal movement. She denies HA/BV/RUQ pain.   She is worried about prolonged admission and the time away from her daughter who is 210yo.    Vitals:  Blood pressure 130/80, pulse 71, temperature 98 F (36.7 C), temperature source Oral, resp. rate 16, height 5' (1.524 m), weight 77.6 kg, last menstrual period 04/20/2022, SpO2 99 %. Vitals:   10/17/22 1600 10/17/22 1940 10/17/22 2305 10/18/22 0321  BP: (!) 154/86 (!) 149/92 138/80 130/80  Pulse: 90 79 71 71  Resp: 19 18 18 16  $ Temp: 98.2 F (36.8 C) 98.3 F (36.8 C) 98.4 F (36.9 C) 98 F (36.7 C)  TempSrc: Oral Oral Oral Oral  SpO2: 98% 99% 97% 99%  Weight:      Height:       Physical Examination: GENERAL: Well-developed, well-nourished female in no acute distress.   LUNGS: Clear to auscultation bilaterally.  HEART: Regular rate and rhythm. ABDOMEN: Soft, nontender, nondistended. No organomegaly. PELVIC: Not indicated EXTREMITIES: No cyanosis, clubbing, or edema, 2+ distal pulses.   Fetal Monitoring: baseline Baseline 130, min variability, no accels, no decels Toco: no contractions  Labs:  No results found for this or any previous visit (from the past 24 hour(s)).   Imaging Studies:    No results found.   Medications:  Scheduled  aspirin EC  162 mg Oral Daily   docusate sodium  100 mg Oral Daily   labetalol  600 mg Oral TID   pantoprazole  40 mg Oral Daily   pravastatin  40 mg Oral Daily   prenatal multivitamin  1 tablet Oral  Q1200   sertraline  25 mg Oral Daily   I have reviewed the patient's current medications.  ASSESSMENT:  Patient Active Problem List   Diagnosis Date Noted   Fetal growth restriction antepartum 10/11/2022   Chronic hypertension affecting pregnancy 06/20/2022   History of prior pregnancy with IUGR newborn 06/20/2022   Hx of preeclampsia, prior pregnancy, currently pregnant 06/20/2022   History of cesarean delivery 06/19/2022   History of gestational diabetes in prior pregnancy, currently pregnant 06/19/2022   Supervision of high-risk pregnancy 06/19/2022    PLAN: 42yo at 274w6d/f FGR with abnormal dopplers c/b CHTN  1) FGR - Will be s/p BMZ today 1800 - [place]. Continue twice weekly dopplers and NST BID. MFM recommends likely delivery at 28 weeks.  - NICU notified 2/20 of consult.   2) CHTN - Continue labetalol 600 TID. Bps mild range.  - Continue ASA 162  3) Undesired fertility - Discussed desire for permanent sterilization. She would want her tubes removed even in the event that NICU is unable to resuscitate this baby (I.e. <400g). She understands it is permanent and irreversible. Insurance appears to be coe[employer]ut as a prEngineer, structuralorm signed today with the patient.   4) Prior c-section  - Desires repeat. Per MFM, at this time, classical c-section would be recommended.   Continue routine care - GTT and TDAP at 28w.  Rh pos.   Radene Gunning 10/18/2022,7:48 AM

## 2022-10-19 ENCOUNTER — Encounter: Payer: Self-pay | Admitting: Obstetrics & Gynecology

## 2022-10-19 DIAGNOSIS — Z3A26 26 weeks gestation of pregnancy: Secondary | ICD-10-CM | POA: Diagnosis not present

## 2022-10-19 DIAGNOSIS — O365920 Maternal care for other known or suspected poor fetal growth, second trimester, not applicable or unspecified: Secondary | ICD-10-CM | POA: Diagnosis not present

## 2022-10-19 MED ORDER — LORATADINE 10 MG PO TABS
10.0000 mg | ORAL_TABLET | Freq: Every day | ORAL | Status: DC
  Administered 2022-10-19 – 2022-10-20 (×2): 10 mg via ORAL
  Filled 2022-10-19 (×5): qty 1

## 2022-10-19 NOTE — Progress Notes (Addendum)
Patient ID: Nancy Benjamin, female   DOB: December 17, 1980, 42 y.o.   MRN: [id number] [place]) NOTE  Nancy Benjamin is a 42 y.o. G2P0101 at [redacted]w[redacted]d who is admitted for inpatient monitoring of fetal growth restrictions with abnormal dopplers.    Fetal presentation is unsure. Length of Stay:  3  Days  Date of admission:10/16/2022  Subjective: Patient reports feeling well and is without complaints. She denies contractions, vaginal bleeding or leakage of fluid. She reports fetal movement. She denies HA/BV/RUQ pain.    Vitals:  Blood pressure (!) 143/81, pulse 63, temperature 98.2 F (36.8 C), temperature source Oral, resp. rate 17, height 5' (1.524 m), weight 77.6 kg, last menstrual period 04/20/2022, SpO2 98 %. Vitals:   10/18/22 1602 10/18/22 1935 10/18/22 2320 10/19/22 0443  BP: (!) 151/94 138/81 135/76 (!) 143/81  Pulse: 81 77 71 63  Resp: 18 17 18 17  $ Temp: 98.2 F (36.8 C) 98 F (36.7 C) 97.7 F (36.5 C) 98.2 F (36.8 C)  TempSrc: Oral Oral Oral Oral  SpO2: 96% 98% 97% 98%  Weight:      Height:       Physical Examination: GENERAL: Well-developed, well-nourished female in no acute distress.  LUNGS: Clear to auscultation bilaterally.  HEART: Regular rate and rhythm. ABDOMEN: Soft, nontender, nondistended. No organomegaly. PELVIC: Not indicated EXTREMITIES: No cyanosis, clubbing, or edema, 2+ distal pulses.   Fetal Monitoring: baseline Baseline 130, min variability, no accels, no decels Toco: no contractions  Labs:  No results found for this or any previous visit (from the past 24 hour(s)).   Imaging Studies:    [place] OB LIMITED  Result Date: 10/18/2022 ----------------------------------------------------------------------  OBSTETRICS REPORT                       (Signed Final 10/18/2022 09:27 am) ---------------------------------------------------------------------- Patient Info  ID #:       [id number]                         D.O.B.:  011-15-1982 (42 yrs)  Name:       Nancy Benjamin                  Visit Date: 10/17/2022 08:40 am ---------------------------------------------------------------------- Performed By  Attending:        VJohnell ComingsMD         Ref. Address:     [facility]                                                             GYN  Performed By:     HValda Favia         [facility]                                                             [facility]  Referred By:  JENNIFER M             [facility]                    [place] ---------------------------------------------------------------------- Orders  #  Description                           Code        Ordered By  1  [place] MFM OB LIMITED                     76815.01    RAVI SHANKAR  2  [place] MFM UA CORD DOPPLER                [id number]    RAVI SHANKAR ----------------------------------------------------------------------  #  Order #                     Accession #                Episode #  1  [id number]                   [id number]  2  [id number]                   [id number]                 [id number] ---------------------------------------------------------------------- Indications  Maternal care for known or suspected poor      O36.5920  fetal growth, second trimester, not applicable  or unspecified IUGR  Abnormal biochemical screen (Open Spina        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.9  bifida and Trisomy 21)  Hypertension - Chronic/Pre-existing            O10.019  Advanced maternal age multigravida 40+,        O96.522  second trimester  History of cesarean delivery, currently        O34.219  pregnant  Poor obstetric history: Previous gestational   O09.299  diabetes  Poor obstetric history (prior pre-term labor   O09.219  32w)  LR NIPS  [redacted] weeks gestation of pregnancy                Z3A.25 ---------------------------------------------------------------------- Fetal Evaluation  Num Of  Fetuses:         1  Fetal Heart Rate(bpm):  129  Cardiac Activity:       Observed  Presentation:           Breech  Placenta:               Anterior  P. Cord Insertion:      Visualized, central  Amniotic Fluid  AFI FV:      Within normal limits                              Largest Pocket(cm)  2.3 ---------------------------------------------------------------------- OB History  Blood Type:   O+  Maternal Racial/Ethnic Group:   White  Gravidity:    2         Term:   0        Prem:   1        SAB:   0  TOP:          0       Ectopic:  0        Living: 1 ---------------------------------------------------------------------- Gestational Age  LMP:           25w 5d        Date:  04/20/22                  EDD:   01/25/23  Best:          [name] 5d     Det. By:  LMP  (04/20/22)          EDD:   01/25/23 ---------------------------------------------------------------------- Anatomy  Cranium:               Appears normal         Stomach:                Appears normal, left                                                                        sided  Thoracic:              Appears normal         Kidneys:                Appear normal  Diaphragm:             Appears normal         Bladder:                Appears normal ---------------------------------------------------------------------- Doppler - Fetal Vessels  Umbilical Artery                                                              ADFV    RDFV                                                                Yes    Inter                                                                         mit ---------------------------------------------------------------------- Cervix Uterus Adnexa  Cervix  Not visualized (advanced  GA >24wks)  Uterus  No abnormality visualized.  Right Ovary  No adnexal mass visualized.  Left Ovary  No adnexal mass visualized.  Cul De Sac  No free fluid seen.  Adnexa  No abnormality visualized  ---------------------------------------------------------------------- Comments  Nancy Benjamin is a gravida 2 para 1 currently at 25 weeks and  5 days.  She was seen in consultation at the request of Dr.  Elly Modena due to severe IUGR with reversed end-diastolic flow  noted on an ultrasound that was performed yesterday at  [facility].  She has been followed in the MFM office for the past 2 weeks  due to severe IUGR.  Doppler studies of the umbilical arteries  over the last 2 weeks have shown absent end-diastolic flow.  The most recent EFW obtained 2 weeks ago was 276 g (10  ounces, less than the 1st percentile for her gestational age).  On admission, her [place] labs were all within normal limits.  Her  blood pressures have varied from 140/100 to 120's/70's .  She denies any signs or symptoms of preeclampsia today.  Her pregnancy has also been complicated by advanced  maternal age and chronic hypertension treated with labetalol  600 mg 3 times a day.  Her prior pregnancy was also complicated by an IUGR fetus  requiring a preterm delivery at 32+ weeks due to severe  preeclampsia.  She had a cell free DNA test performed earlier in her current  pregnancy indicating a low risk for trisomy 80, 18, and 13.  A  female fetus is predicted.  Her current pregnancy is also complicated by an elevated  MSAFP level of 4.27 MoM. The patient has been advised that  placental dysfunction is the most likely cause of IUGR.  Due to the increased risk of early onset severe preeclampsia  associated with IUGR due to placental dysfunction, she has  been taking 2 tablets of baby aspirin daily for preeclampsia  prophylaxis.  On today's ultrasound exam, intermittent reversed end-  diastolic flow is noted on her umbilical artery Doppler studies.  Absent end-diastolic flow is noted the majority of the time.  Low normal amniotic fluid with a maximal vertical pocket of  2.3 cm is noted.  Vigorous fetal movements were noted  throughout today's exam.  The  fetus was in the breech  presentation today.  Due to severe IUGR with intermittent absent end-diastolic  flow, she should continue inpatient management with daily  fetal testing until delivery.  The patient understands that viability is currently considered  when the EFW is 400 g or greater.  The fact that she is 25  weeks and 5 days indicates a better prognosis for her baby  should she require delivery in the near future.  She should receive a complete course of antenatal  corticosteroids.  She should also receive magnesium sulfate for fetal  neuroprotection for at least 4 hours (if possible) prior to  delivery.  While hospitalized, she should continue taking 2 tablets of  baby aspirin daily (81 mg each) for preeclampsia prophylaxis.  She should have twice weekly umbilical artery Doppler  studies performed to determine if there is persistent reversed  end-diastolic flow.  She should have a growth ultrasound performed early next  week.  The patient understands that our goal for her pregnancy is for  her to deliver at 28 weeks or greater.  Delivery will be indicated should there be persistent reversed  end-diastolic flow noted on her umbilical artery Doppler  studies, the  overall EFW is 400 g or greater, and she is  greater than 28 weeks.  Delivery will also be recommended at any time for  nonreassuring fetal status.  The patient understands that should the EFW be less than  400 g at the time of delivery, there is a possibility that her  baby may not be able to be resuscitated by the NICU team.  She should have a NICU consult scheduled.  She should receive a rescue course of steroids should she  require delivery and it has been 7 days or greater since she  received the initial course.  The patient has stated that she will have a tubal ligation  performed at the time of her cesarean delivery.  Due to the  smaller fetal size, a classical uterine incision is recommended  in order to deliver the fetus in the most  atraumatic fashion.  We will continue to follow her closely with you.  The patient stated that all of her questions were answered  today. ----------------------------------------------------------------------                   Johnell Comings, MD Electronically Signed Final Report   10/18/2022 09:27 am ----------------------------------------------------------------------  [place] MFM UA CORD DOPPLER  Result Date: 10/18/2022 ----------------------------------------------------------------------  OBSTETRICS REPORT                       (Signed Final 10/18/2022 09:27 am) ---------------------------------------------------------------------- Patient Info  ID #:       [id number]                          D.O.B.:  1981-03-22 (42 yrs)  Name:       Nancy Benjamin                   Visit Date: 10/17/2022 08:40 am ---------------------------------------------------------------------- Performed By  Attending:        Johnell Comings MD         Ref. Address:     [facility]                                                             GYN  Performed By:     Valda Favia          Secondary Phy.:   [facility]                    RDMS                                                             Care  Referred By:      Clearnce Sorrel             [facility]  [place] ---------------------------------------------------------------------- Orders  #  Description                           Code        Ordered By  1  [place] MFM OB LIMITED                     76815.01    RAVI SHANKAR  2  [place] MFM UA CORD DOPPLER                76820.02    RAVI [facility] ----------------------------------------------------------------------  #  Order #                     Accession #                Episode #  1  [id number]                   [id number]                 [id number]  2  [id number]                   [id number]                 [id number]  ---------------------------------------------------------------------- Indications  Maternal care for known or suspected poor      O36.5920  fetal growth, second trimester, not applicable  or unspecified IUGR  Abnormal biochemical screen (Open Spina        O28.9  bifida and Trisomy 21)  Hypertension - Chronic/Pre-existing            O56.019  Advanced maternal age multigravida 45+,        O18.522  second trimester  History of cesarean delivery, currently        O34.219  pregnant  Poor obstetric history: Previous gestational   O09.299  diabetes  Poor obstetric history (prior pre-term labor   O09.219  32w)  LR NIPS  [redacted] weeks gestation of pregnancy                Z3A.25 ---------------------------------------------------------------------- Fetal Evaluation  Num Of Fetuses:         1  Fetal Heart Rate(bpm):  129  Cardiac Activity:       Observed  Presentation:           Breech  Placenta:               Anterior  P. Cord Insertion:      Visualized, central  Amniotic Fluid  AFI FV:      Within normal limits                              Largest Pocket(cm)                              2.3 ---------------------------------------------------------------------- OB History  Blood Type:   O+  Maternal Racial/Ethnic Group:   White  Gravidity:    2         Term:   0        Prem:   1        SAB:   0  TOP:          0       Ectopic:  0  Living: 1 ---------------------------------------------------------------------- Gestational Age  LMP:           25w 5d        Date:  04/20/22                  EDD:   01/25/23  Best:          [name] 5d     Det. By:  LMP  (04/20/22)          EDD:   01/25/23 ---------------------------------------------------------------------- Anatomy  Cranium:               Appears normal         Stomach:                Appears normal, left                                                                        sided  Thoracic:              Appears normal         Kidneys:                Appear normal  Diaphragm:              Appears normal         Bladder:                Appears normal ---------------------------------------------------------------------- Doppler - Fetal Vessels  Umbilical Artery                                                              ADFV    RDFV                                                                Yes    Inter                                                                         mit ---------------------------------------------------------------------- Cervix Uterus Adnexa  Cervix  Not visualized (advanced GA >24wks)  Uterus  No abnormality visualized.  Right Ovary  No adnexal mass visualized.  Left Ovary  No adnexal mass visualized.  Cul De Sac  No free fluid seen.  Adnexa  No abnormality visualized ---------------------------------------------------------------------- Comments  Nancy Benjamin is a gravida 2 para 1 currently at 25 weeks and  5 days.  She was seen in consultation at the request of Dr.  Elly Modena due to severe IUGR with reversed end-diastolic flow  noted on an ultrasound  that was performed yesterday at  [facility].  She has been followed in the MFM office for the past 2 weeks  due to severe IUGR.  Doppler studies of the umbilical arteries  over the last 2 weeks have shown absent end-diastolic flow.  The most recent EFW obtained 2 weeks ago was 276 g (10  ounces, less than the 1st percentile for her gestational age).  On admission, her [place] labs were all within normal limits.  Her  blood pressures have varied from 140/100 to 120's/70's .  She denies any signs or symptoms of preeclampsia today.  Her pregnancy has also been complicated by advanced  maternal age and chronic hypertension treated with labetalol  600 mg 3 times a day.  Her prior pregnancy was also complicated by an IUGR fetus  requiring a preterm delivery at 32+ weeks due to severe  preeclampsia.  She had a cell free DNA test performed earlier in her current  pregnancy indicating a low risk for trisomy 1, 18, and 13.  A   female fetus is predicted.  Her current pregnancy is also complicated by an elevated  MSAFP level of 4.27 MoM. The patient has been advised that  placental dysfunction is the most likely cause of IUGR.  Due to the increased risk of early onset severe preeclampsia  associated with IUGR due to placental dysfunction, she has  been taking 2 tablets of baby aspirin daily for preeclampsia  prophylaxis.  On today's ultrasound exam, intermittent reversed end-  diastolic flow is noted on her umbilical artery Doppler studies.  Absent end-diastolic flow is noted the majority of the time.  Low normal amniotic fluid with a maximal vertical pocket of  2.3 cm is noted.  Vigorous fetal movements were noted  throughout today's exam.  The fetus was in the breech  presentation today.  Due to severe IUGR with intermittent absent end-diastolic  flow, she should continue inpatient management with daily  fetal testing until delivery.  The patient understands that viability is currently considered  when the EFW is 400 g or greater.  The fact that she is 25  weeks and 5 days indicates a better prognosis for her baby  should she require delivery in the near future.  She should receive a complete course of antenatal  corticosteroids.  She should also receive magnesium sulfate for fetal  neuroprotection for at least 4 hours (if possible) prior to  delivery.  While hospitalized, she should continue taking 2 tablets of  baby aspirin daily (81 mg each) for preeclampsia prophylaxis.  She should have twice weekly umbilical artery Doppler  studies performed to determine if there is persistent reversed  end-diastolic flow.  She should have a growth ultrasound performed early next  week.  The patient understands that our goal for her pregnancy is for  her to deliver at 28 weeks or greater.  Delivery will be indicated should there be persistent reversed  end-diastolic flow noted on her umbilical artery Doppler  studies, the overall EFW is 400 g or  greater, and she is  greater than 28 weeks.  Delivery will also be recommended at any time for  nonreassuring fetal status.  The patient understands that should the EFW be less than  400 g at the time of delivery, there is a possibility that her  baby may not be able to be resuscitated by the NICU team.  She should have a NICU consult scheduled.  She should receive a rescue course of steroids  should she  require delivery and it has been 7 days or greater since she  received the initial course.  The patient has stated that she will have a tubal ligation  performed at the time of her cesarean delivery.  Due to the  smaller fetal size, a classical uterine incision is recommended  in order to deliver the fetus in the most atraumatic fashion.  We will continue to follow her closely with you.  The patient stated that all of her questions were answered  today. ----------------------------------------------------------------------                   Johnell Comings, MD Electronically Signed Final Report   10/18/2022 09:27 am ----------------------------------------------------------------------    Medications:  Scheduled  aspirin EC  162 mg Oral Daily   docusate sodium  100 mg Oral Daily   labetalol  600 mg Oral TID   pantoprazole  40 mg Oral Daily   pravastatin  40 mg Oral Daily   prenatal multivitamin  1 tablet Oral Q1200   sertraline  25 mg Oral Daily   I have reviewed the patient's current medications.  ASSESSMENT:  Patient Active Problem List   Diagnosis Date Noted   Fetal growth restriction antepartum 10/11/2022   Chronic hypertension affecting pregnancy 06/20/2022   History of prior pregnancy with IUGR newborn 06/20/2022   Hx of preeclampsia, prior pregnancy, currently pregnant 06/20/2022   History of cesarean delivery 06/19/2022   History of gestational diabetes in prior pregnancy, currently pregnant 06/19/2022   Supervision of high-risk pregnancy 06/19/2022    PLAN: 42 yo at 50w0da/f FGR with  abnormal dopplers c/b CHTN  1) FGR - s/p BMZ - F/U [place] with AEDF. Continue twice weekly dopplers and NST BID. MFM recommends likely delivery at 28 weeks.  - NICU notified 2/20 of consult.   2) CHTN - Continue labetalol 600 TID. Bps mild range.  - Continue ASA 162  3) Undesired fertility - Previously discussed desire for permanent sterilization on 2/21. She would want her tubes removed even in the event that NICU is unable to resuscitate this baby (I.e. <400g). She understands it is permanent and irreversible. Insurance appears to be ceBaybut as a precaution state form signed with the patient on 2/21.   4) Prior c-section  - Desires repeat. Per MFM, at this time, classical c-section would be recommended. Per MFM, ideally would have Magnesium for neuro protection for at least 4 hr prior to delivery depending on the circumstances.   Continue routine care - GTT and TDAP at 28w. Rh pos.   PRadene Gunning2/22/2024,7:52 AM

## 2022-10-19 NOTE — Consult Note (Addendum)
.  Consultation Service: Neonatology   Dr. Harolyn Rutherford has asked for consultation on Nancy Benjamin regarding the care of a premature infant at 26+1 wga. Thank you for inviting [place] to see this patient.   Reason for consult:  Explain the possible complications, the prognosis, and the care of a premature infant at 62 and 1/7 weeks.  Chief complaint: 42 y.o. female with a single IUP with an estimated weight of 341 g on 10/20/22. Pregnancy has been complicated by profound IUGR. Plan is for delivery @ 28 weeks or greater, if persistent abnormal umbilical artery doppler, and overall EFW >400g, OR prior to that time if fetal status non-reassuring - by c/s pending decisions regarding goals of care.   My key findings of this patient's HPI are:  Nancy Benjamin is a G2P1 currently at 26+[redacted] wks gestation.  Her pregnancy has been complicated by cHTN requiring labatelol, AMA, and history of prior pregnancy c/b IUGR/ severe PreE/ preterm delivery at 32wk.  (That baby, Nancy Benjamin, was born here at [facility] and is a healthy 2.40 yo female now).  Prenatal labs reviewed.   cfDNA low risk for aneuploidy, female fetus. msAFP elevated to 4.27 MoM-  increased risk of ONTD/ TS21.  No e/o spina bifida on [place], and nuchal translucency was normal in 1st tm.  Elevated msAFP in absence of anatomic findings is associated w/ severe IUGR, preterm delivery, significant placental dysfunction, and high perinatal morbidity among other complications.  She was referred to MFM @ 23+4 wks for further evaluation.  Since that time, IUGR has continued, interval gain of only 65 grams over 3 weeks   (276 g grams- <1%ile on 2/5, 23+4 wga). Current EFW 341 g, <1 %ile, with HC -3 to -4 SD.  Additionally, absent --> reversed end diastolic flow in umbilical artery.  She was admitted for inpatient monitoring on 10/16/22 due to the progression of diastolic doppler findings. She was given BMZ.  Fetal monitoring three times daily has been reassuring, and there are no signs of labor  or PreE.    Prenatal care:   good Pregnancy complications:  chronic HTN, severe IUGR, h/o PreE Maternal antibiotics: This patient's mother is not on file. Maternal Steroids: BMZ initiated 10/16/22 Most recent dose:  10/17/22    My recommendations for this patient and my actions included:   1. In the presence of the [employer] and NNP Terese Door, I spent 30 minutes discussing the possible complications and outcomes of prematurity at this gestational age, all risks higher due to profoundly low EFW and severe IUGR. I discussed specific complications at this gestational age referencing the need for resuscitation at birth, mechanical ventilation and surfactant administration for respiratory distress, IV fluids pending establishment of enteral feeds (encouraged breast milk feeding), antibiotics for possible sepsis, temperature support, and monitoring. I also discussed the very high risk of complications such as intracranial hemorrhage, retinopathy, hearing deficit, and chronic lung disease. I discussed this with parents in detail and they expressed an understanding of the risks and complications of extreme prematurity and ELBW.   2. I also discussed the expected survival of an infant born at [redacted] weeks gestational age which is poor in this particular case.  We further discussed that many of the neonates born at this age, with this particular set of risk factors, have profound or severe neurological complications and school difficulties.   Many of the neonates born at this age will have some for of mild to moderate neurological complications. She expressed an understanding  of this information.   3. I informed her that the NICU team would be present at the delivery.  I reviewed that the baby's chance of survival is extremely low at this EFW, due to inability to secure / protect the airway, inability to support various organ systems due to limitations of available equipment due to size, and the severely  elevated risk of devastating IVH.  I reviewed that a neonatologist will be present at delivery to assess her son, and offer interventions that carry a reasonable chance of prolonging life while mitigating harm, such as intubation if possible.  If intubation is not possible due to extreme LBW, or he does not respond to intubation, I reviewed that interventions beyond intubation are nearly certain to be futile.  She indicated that, in this case, she would want the NICU team to wrap her son in warm blankets, provide any other comfort measures deemed appropriate, and allow skin to skin immediately.  She wants to maximize her time holding him with her husband at that time, and allow him to meet his older sister, Nancy Benjamin.  I reviewed that he may survive for a very short time or several hours, and that the NICU team will remain immediately available to support her family, assess him only if she requests, and offer support when death occurs to involve providing cool cot to allow as much time as needed to spend with her son.  I inquired regarding her desires for any spiritual support or ceremony, and she is uncertain at this time but will speak with her husband.   4.  If delivery is indicated, rescue course of BMZ should be initiated if >7 days from previous course ending 10/17/22.   5. NICU team available to support family and obstetric care team in any way, please call for additional consultation if acute changes in clinical status or family request.   Final Impression:    42 y.o. female with a single IUP of uncertain viability who is at high risk for an indicated preterm delivery and who now understands the possible complications and current grim prognosis of her infant. The mother agrees with the above detailed plan for postnatal assessment and potential efforts at rescusictaiton and / or measures to ensure her son's comfort if death is imminent. Nancy Benjamin's questions were answered. She is planning to try and  provide breast milk for her infant.    ______________________________________________________________________  Thank you for asking [place] to participate in the care of this patient. Please do not hesitate to contact us again if you are aware of any further ways we can be of assistance.   Sincerely,  Denna Haggard, MD Attending Neonatologist   I spent ~60 minutes in consultation time, of which 30 minutes was spent in direct face to face counseling.

## 2022-10-20 ENCOUNTER — Inpatient Hospital Stay (HOSPITAL_COMMUNITY): Payer: 59

## 2022-10-20 ENCOUNTER — Inpatient Hospital Stay (HOSPITAL_BASED_OUTPATIENT_CLINIC_OR_DEPARTMENT_OTHER): Payer: 59

## 2022-10-20 DIAGNOSIS — O368320 Maternal care for abnormalities of the fetal heart rate or rhythm, second trimester, not applicable or unspecified: Secondary | ICD-10-CM

## 2022-10-20 DIAGNOSIS — O34219 Maternal care for unspecified type scar from previous cesarean delivery: Secondary | ICD-10-CM

## 2022-10-20 DIAGNOSIS — O3508X0 Maternal care for (suspected) central nervous system malformation or damage in fetus, spina bifida, not applicable or unspecified: Secondary | ICD-10-CM

## 2022-10-20 DIAGNOSIS — O365920 Maternal care for other known or suspected poor fetal growth, second trimester, not applicable or unspecified: Secondary | ICD-10-CM

## 2022-10-20 DIAGNOSIS — O09292 Supervision of pregnancy with other poor reproductive or obstetric history, second trimester: Secondary | ICD-10-CM

## 2022-10-20 DIAGNOSIS — Z8632 Personal history of gestational diabetes: Secondary | ICD-10-CM

## 2022-10-20 DIAGNOSIS — O09522 Supervision of elderly multigravida, second trimester: Secondary | ICD-10-CM

## 2022-10-20 DIAGNOSIS — O3513X0 Maternal care for (suspected) chromosomal abnormality in fetus, trisomy 21, not applicable or unspecified: Secondary | ICD-10-CM | POA: Diagnosis not present

## 2022-10-20 DIAGNOSIS — O10012 Pre-existing essential hypertension complicating pregnancy, second trimester: Secondary | ICD-10-CM | POA: Diagnosis not present

## 2022-10-20 DIAGNOSIS — Z3A26 26 weeks gestation of pregnancy: Secondary | ICD-10-CM

## 2022-10-20 DIAGNOSIS — O09212 Supervision of pregnancy with history of pre-term labor, second trimester: Secondary | ICD-10-CM

## 2022-10-20 DIAGNOSIS — O365990 Maternal care for other known or suspected poor fetal growth, unspecified trimester, not applicable or unspecified: Secondary | ICD-10-CM | POA: Diagnosis not present

## 2022-10-20 NOTE — Progress Notes (Signed)
Patient ID: Nancy Benjamin, female   DOB: 10-19-1980, 42 y.o.   MRN: [id number] Called to see patient secondary to inability to find FHR. No bleeding and no LOF> Bedside u/s inconclusive.  Stat bedside u/s ordered and revealed IUFD. Results given to patient. Discussed options for delivery. She still wants BTL.  Would not recommend C-section for IUFD, and advised as such. She will call family and consider IOL.

## 2022-10-20 NOTE — Progress Notes (Signed)
Patient ID: Nancy Benjamin, female   DOB: 10-25-1980, 42 y.o.   MRN: [id number] [place]) NOTE  Nancy Benjamin is a 42 y.o. G2P0101 at [redacted]w[redacted]d who is admitted for inpatient monitoring of fetal growth restrictions with abnormal dopplers, history of CHTN.    Fetal presentation is unsure. Length of Stay:  4  Days  Date of admission:10/16/2022  Subjective: Patient reports feeling well and is without complaints. She denies contractions, vaginal bleeding or leakage of fluid. She reports fetal movement. She is s/p doppler studies this morning.   Vitals:  Blood pressure (!) 144/79, pulse 83, temperature 98.1 F (36.7 C), temperature source Oral, resp. rate 18, height 5' (1.524 m), weight 77.6 kg, last menstrual period 04/20/2022, SpO2 97 %. Vitals:   10/19/22 2135 10/20/22 0012 10/20/22 0430 10/20/22 0939  BP:  136/80 133/79 (!) 144/79  Pulse:  72 69 83  Resp:  18 18   Temp:    98.1 F (36.7 C)  TempSrc:    Oral  SpO2: 95% 99% 98% 97%  Weight:      Height:       Physical Examination: GENERAL: Well-developed, well-nourished female in no acute distress.  LUNGS: Clear to auscultation bilaterally.  HEART: Regular rate and rhythm. ABDOMEN: Soft, nontender, nondistended. No organomegaly. PELVIC: Not indicated EXTREMITIES: No cyanosis, clubbing, or edema, 2+ distal pulses.   Fetal Monitoring: baseline Baseline 130, min variability, no accels, no decels Toco: no contractions  Labs:  No results found for this or any previous visit (from the past 24 hour(s)).   Imaging Studies:    [place] OB LIMITED  Result Date: 10/18/2022 ----------------------------------------------------------------------  OBSTETRICS REPORT                       (Signed Final 10/18/2022 09:27 am) ---------------------------------------------------------------------- Patient Info  ID #:       [id number]                         D.O.B.:  0November 20, 1982(41 yrs)  Name:       Nancy Benjamin                   Visit Date: 10/17/2022 08:40 am ---------------------------------------------------------------------- Performed By  Attending:        VJohnell ComingsMD         Ref. Address:     [facility]                                                             GYN  Performed By:     HValda Favia         Secondary Phy.:   [facility]                                                             Care  Referred By:      JClearnce Sorrel  [facility]                    [place] ---------------------------------------------------------------------- Orders  #  Description                           Code        Ordered By  1  [place] MFM OB LIMITED                     76815.01    RAVI SHANKAR  2  [place] MFM UA CORD DOPPLER                76820.02    RAVI SHANKAR ----------------------------------------------------------------------  #  Order #                     Accession #                Episode #  1  [id number]                   [id number]                 [id number]  2  [id number]                   [id number]                 [id number] ---------------------------------------------------------------------- Indications  Maternal care for known or suspected poor      O36.5920  fetal growth, second trimester, not applicable  or unspecified IUGR  Abnormal biochemical screen (Open Spina        O28.9  bifida and Trisomy 21)  Hypertension - Chronic/Pre-existing            O10.019  Advanced maternal age multigravida 53+,        O20.522  second trimester  History of cesarean delivery, currently        O34.219  pregnant  Poor obstetric history: Previous gestational   O09.299  diabetes  Poor obstetric history (prior pre-term labor   O09.219  32w)  LR NIPS  [redacted] weeks gestation of pregnancy                Z3A.25 ---------------------------------------------------------------------- Fetal Evaluation  Num Of Fetuses:         1  Fetal Heart Rate(bpm):  129   Cardiac Activity:       Observed  Presentation:           Breech  Placenta:               Anterior  P. Cord Insertion:      Visualized, central  Amniotic Fluid  AFI FV:      Within normal limits                              Largest Pocket(cm)                              2.3 ---------------------------------------------------------------------- OB History  Blood Type:   O+  Maternal Racial/Ethnic  Group:   White  Gravidity:    2         Term:   0        Prem:   1        SAB:   0  TOP:          0       Ectopic:  0        Living: 1 ---------------------------------------------------------------------- Gestational Age  LMP:           25w 5d        Date:  04/20/22                  EDD:   01/25/23  Best:          Nancy Benjamin 5d     Det. By:  LMP  (04/20/22)          EDD:   01/25/23 ---------------------------------------------------------------------- Anatomy  Cranium:               Appears normal         Stomach:                Appears normal, left                                                                        sided  Thoracic:              Appears normal         Kidneys:                Appear normal  Diaphragm:             Appears normal         Bladder:                Appears normal ---------------------------------------------------------------------- Doppler - Fetal Vessels  Umbilical Artery                                                              ADFV    RDFV                                                                Yes    Inter                                                                         mit ---------------------------------------------------------------------- Cervix Uterus Adnexa  Cervix  Not visualized (advanced GA >24wks)  Uterus  No abnormality visualized.  Right Ovary  No  adnexal mass visualized.  Left Ovary  No adnexal mass visualized.  Cul De Sac  No free fluid seen.  Adnexa  No abnormality visualized ---------------------------------------------------------------------- Comments  Bennetta Laos is a gravida 2 para 1 currently at 25 weeks and  5 days.  She was seen in consultation at the request of Dr.  Elly Modena due to severe IUGR with reversed end-diastolic flow  noted on an ultrasound that was performed yesterday at  [facility].  She has been followed in the MFM office for the past 2 weeks  due to severe IUGR.  Doppler studies of the umbilical arteries  over the last 2 weeks have shown absent end-diastolic flow.  The most recent EFW obtained 2 weeks ago was 276 g (10  ounces, less than the 1st percentile for her gestational age).  On admission, her [facility] labs were all within normal limits.  Her  blood pressures have varied from 140/100 to 120's/70's .  She denies any signs or symptoms of preeclampsia today.  Her pregnancy has also been complicated by advanced  maternal age and chronic hypertension treated with labetalol  600 mg 3 times a day.  Her prior pregnancy was also complicated by an IUGR fetus  requiring a preterm delivery at 32+ weeks due to severe  preeclampsia.  She had a cell free DNA test performed earlier in her current  pregnancy indicating a low risk for trisomy 56, 18, and 13.  A  female fetus is predicted.  Her current pregnancy is also complicated by an elevated  MSAFP level of 4.27 MoM. The patient has been advised that  placental dysfunction is the most likely cause of IUGR.  Due to the increased risk of early onset severe preeclampsia  associated with IUGR due to placental dysfunction, she has  been taking 2 tablets of baby aspirin daily for preeclampsia  prophylaxis.  On today's ultrasound exam, intermittent reversed end-  diastolic flow is noted on her umbilical artery Doppler studies.  Absent end-diastolic flow is noted the majority of the time.  Low normal amniotic fluid with a maximal vertical pocket of  2.3 cm is noted.  Vigorous fetal movements were noted  throughout today's exam.  The fetus was in the breech  presentation today.  Due to severe IUGR with intermittent absent  end-diastolic  flow, she should continue inpatient management with daily  fetal testing until delivery.  The patient understands that viability is currently considered  when the EFW is 400 g or greater.  The fact that she is 25  weeks and 5 days indicates a better prognosis for her baby  should she require delivery in the near future.  She should receive a complete course of antenatal  corticosteroids.  She should also receive magnesium sulfate for fetal  neuroprotection for at least 4 hours (if possible) prior to  delivery.  While hospitalized, she should continue taking 2 tablets of  baby aspirin daily (81 mg each) for preeclampsia prophylaxis.  She should have twice weekly umbilical artery Doppler  studies performed to determine if there is persistent reversed  end-diastolic flow.  She should have a growth ultrasound performed early next  week.  The patient understands that our goal for her pregnancy is for  her to deliver at 28 weeks or greater.  Delivery will be indicated should there be persistent reversed  end-diastolic flow noted on her umbilical artery Doppler  studies, the overall EFW is 400 g or greater, and she is  greater than  28 weeks.  Delivery will also be recommended at any time for  nonreassuring fetal status.  The patient understands that should the EFW be less than  400 g at the time of delivery, there is a possibility that her  baby may not be able to be resuscitated by the NICU team.  She should have a NICU consult scheduled.  She should receive a rescue course of steroids should she  require delivery and it has been 7 days or greater since she  received the initial course.  The patient has stated that she will have a tubal ligation  performed at the time of her cesarean delivery.  Due to the  smaller fetal size, a classical uterine incision is recommended  in order to deliver the fetus in the most atraumatic fashion.  We will continue to follow her closely with you.  The patient stated that  all of her questions were answered  today. ----------------------------------------------------------------------                   Johnell Comings, MD Electronically Signed Final Report   10/18/2022 09:27 am ----------------------------------------------------------------------  [place] MFM UA CORD DOPPLER  Result Date: 10/18/2022 ----------------------------------------------------------------------  OBSTETRICS REPORT                       (Signed Final 10/18/2022 09:27 am) ---------------------------------------------------------------------- Patient Info  ID #:       [id number]                          D.O.B.:  23-Jul-1981 (41 yrs)  Name:       Bennetta Laos                   Visit Date: 10/17/2022 08:40 am ---------------------------------------------------------------------- Performed By  Attending:        Johnell Comings MD         Ref. Address:     [facility]                                                             GYN  Performed By:     Valda Favia          Secondary Phy.:   [facility]                    [facility]                                                             Care  Referred By:      Clearnce Sorrel             [facility]                    [place] ----------------------------------------------------------------------  Orders  #  Description                           Code        Ordered By  1  [place] MFM OB LIMITED                     [id number]    RAVI SHANKAR  2  [place] MFM UA CORD DOPPLER                76820.02    RAVI SHANKAR ----------------------------------------------------------------------  #  Order #                     Accession #                Episode #  1  [id number]                   [id number]                 [id number]  2  [id number]                   [id number]                 [id number] ---------------------------------------------------------------------- Indications  Maternal care for known or suspected poor      O36.5920   fetal growth, second trimester, not applicable  or unspecified IUGR  Abnormal biochemical screen (Open Spina        O28.9  bifida and Trisomy 21)  Hypertension - Chronic/Pre-existing            O53.019  Advanced maternal age multigravida 64+,        O42.522  second trimester  History of cesarean delivery, currently        O34.219  pregnant  Poor obstetric history: Previous gestational   O09.299  diabetes  Poor obstetric history (prior pre-term labor   O09.219  32w)  LR NIPS  [redacted] weeks gestation of pregnancy                Z3A.25 ---------------------------------------------------------------------- Fetal Evaluation  Num Of Fetuses:         1  Fetal Heart Rate(bpm):  129  Cardiac Activity:       Observed  Presentation:           Breech  Placenta:               Anterior  P. Cord Insertion:      Visualized, central  Amniotic Fluid  AFI FV:      Within normal limits                              Largest Pocket(cm)                              2.3 ---------------------------------------------------------------------- OB History  Blood Type:   O+  Maternal Racial/Ethnic Group:   White  Gravidity:    2         Term:   0        Prem:   1        SAB:   0  TOP:          0       Ectopic:  0  Living: 1 ---------------------------------------------------------------------- Gestational Age  LMP:           25w 5d        Date:  04/20/22                  EDD:   01/25/23  Best:          Nancy Benjamin 5d     Det. By:  LMP  (04/20/22)          EDD:   01/25/23 ---------------------------------------------------------------------- Anatomy  Cranium:               Appears normal         Stomach:                Appears normal, left                                                                        sided  Thoracic:              Appears normal         Kidneys:                Appear normal  Diaphragm:             Appears normal         Bladder:                Appears normal ----------------------------------------------------------------------  Doppler - Fetal Vessels  Umbilical Artery                                                              ADFV    RDFV                                                                Yes    Inter                                                                         mit ---------------------------------------------------------------------- Cervix Uterus Adnexa  Cervix  Not visualized (advanced GA >24wks)  Uterus  No abnormality visualized.  Right Ovary  No adnexal mass visualized.  Left Ovary  No adnexal mass visualized.  Cul De Sac  No free fluid seen.  Adnexa  No abnormality visualized ---------------------------------------------------------------------- Comments  Bennetta Laos is a gravida 2 para 1 currently at 25 weeks and  5 days.  She was seen in consultation at the request of Dr.  Elly Modena due to severe IUGR with reversed end-diastolic flow  noted on an ultrasound  that was performed yesterday at  [facility].  She has been followed in the MFM office for the past 2 weeks  due to severe IUGR.  Doppler studies of the umbilical arteries  over the last 2 weeks have shown absent end-diastolic flow.  The most recent EFW obtained 2 weeks ago was 276 g (10  ounces, less than the 1st percentile for her gestational age).  On admission, her [place] labs were all within normal limits.  Her  blood pressures have varied from 140/100 to 120's/70's .  She denies any signs or symptoms of preeclampsia today.  Her pregnancy has also been complicated by advanced  maternal age and chronic hypertension treated with labetalol  600 mg 3 times a day.  Her prior pregnancy was also complicated by an IUGR fetus  requiring a preterm delivery at 32+ weeks due to severe  preeclampsia.  She had a cell free DNA test performed earlier in her current  pregnancy indicating a low risk for trisomy 57, 18, and 13.  A  female fetus is predicted.  Her current pregnancy is also complicated by an elevated  MSAFP level of 4.27 MoM. The patient has been advised  that  placental dysfunction is the most likely cause of IUGR.  Due to the increased risk of early onset severe preeclampsia  associated with IUGR due to placental dysfunction, she has  been taking 2 tablets of baby aspirin daily for preeclampsia  prophylaxis.  On today's ultrasound exam, intermittent reversed end-  diastolic flow is noted on her umbilical artery Doppler studies.  Absent end-diastolic flow is noted the majority of the time.  Low normal amniotic fluid with a maximal vertical pocket of  2.3 cm is noted.  Vigorous fetal movements were noted  throughout today's exam.  The fetus was in the breech  presentation today.  Due to severe IUGR with intermittent absent end-diastolic  flow, she should continue inpatient management with daily  fetal testing until delivery.  The patient understands that viability is currently considered  when the EFW is 400 g or greater.  The fact that she is 25  weeks and 5 days indicates a better prognosis for her baby  should she require delivery in the near future.  She should receive a complete course of antenatal  corticosteroids.  She should also receive magnesium sulfate for fetal  neuroprotection for at least 4 hours (if possible) prior to  delivery.  While hospitalized, she should continue taking 2 tablets of  baby aspirin daily (81 mg each) for preeclampsia prophylaxis.  She should have twice weekly umbilical artery Doppler  studies performed to determine if there is persistent reversed  end-diastolic flow.  She should have a growth ultrasound performed early next  week.  The patient understands that our goal for her pregnancy is for  her to deliver at 28 weeks or greater.  Delivery will be indicated should there be persistent reversed  end-diastolic flow noted on her umbilical artery Doppler  studies, the overall EFW is 400 g or greater, and she is  greater than 28 weeks.  Delivery will also be recommended at any time for  nonreassuring fetal status.  The patient  understands that should the EFW be less than  400 g at the time of delivery, there is a possibility that her  baby may not be able to be resuscitated by the NICU team.  She should have a NICU consult scheduled.  She should receive a rescue course of steroids  should she  require delivery and it has been 7 days or greater since she  received the initial course.  The patient has stated that she will have a tubal ligation  performed at the time of her cesarean delivery.  Due to the  smaller fetal size, a classical uterine incision is recommended  in order to deliver the fetus in the most atraumatic fashion.  We will continue to follow her closely with you.  The patient stated that all of her questions were answered  today. ----------------------------------------------------------------------                   Johnell Comings, MD Electronically Signed Final Report   10/18/2022 09:27 am ----------------------------------------------------------------------    Medications:  Scheduled  aspirin EC  162 mg Oral Daily   docusate sodium  100 mg Oral Daily   labetalol  600 mg Oral TID   loratadine  10 mg Oral Daily   pantoprazole  40 mg Oral Daily   pravastatin  40 mg Oral Daily   prenatal multivitamin  1 tablet Oral Q1200   sertraline  25 mg Oral Daily   I have reviewed the patient's current medications.  ASSESSMENT:  Patient Active Problem List   Diagnosis Date Noted   Fetal growth restriction antepartum 10/11/2022   Chronic hypertension affecting pregnancy 06/20/2022   History of prior pregnancy with IUGR newborn 06/20/2022   Hx of preeclampsia, prior pregnancy, currently pregnant 06/20/2022   History of cesarean delivery 06/19/2022   History of gestational diabetes in prior pregnancy, currently pregnant 06/19/2022   Supervision of high-risk pregnancy 06/19/2022    PLAN: 42 yo at 49w1da/f FGR with abnormal dopplers c/b CHTN  1) FGR - s/p BMZ - Last [place]owed IREDF with AEDF, will follow up read  today. Continue twice weekly dopplers and NST BID. MFM recommends likely delivery at 28 weeks.  - NICU notified 2/22 of consult, they said they come today   2) CHTN - Continue labetalol 600 TID. Bps mild range.  - Continue ASA 162  3) Undesired fertility - Previously discussed desire for permanent sterilization on 2/21. She would want her tubes removed even in the event that NICU is unable to resuscitate this baby (I.e. <400g). She understands it is permanent and irreversible. Insurance appears to be ceBaybut as a precaution state form signed with the patient on 2/21.   4) Prior c-section  - Desires repeat. Per MFM, at this time, classical c-section would be recommended. Per MFM, ideally would have Magnesium for neuro protection for at least 4 hr prior to delivery depending on the circumstances.   Continue routine care - GTT and TDAP at 28w. Rh pos.   UVerita Schneiders MD 10/20/2022,10:29 AM

## 2022-10-21 ENCOUNTER — Inpatient Hospital Stay (HOSPITAL_COMMUNITY): Payer: 59 | Admitting: Anesthesiology

## 2022-10-21 ENCOUNTER — Encounter (HOSPITAL_COMMUNITY): Payer: Self-pay | Admitting: Obstetrics and Gynecology

## 2022-10-21 DIAGNOSIS — O09523 Supervision of elderly multigravida, third trimester: Secondary | ICD-10-CM | POA: Diagnosis not present

## 2022-10-21 DIAGNOSIS — O34211 Maternal care for low transverse scar from previous cesarean delivery: Secondary | ICD-10-CM | POA: Diagnosis not present

## 2022-10-21 DIAGNOSIS — O364XX0 Maternal care for intrauterine death, not applicable or unspecified: Secondary | ICD-10-CM | POA: Diagnosis not present

## 2022-10-21 DIAGNOSIS — O1002 Pre-existing essential hypertension complicating childbirth: Secondary | ICD-10-CM | POA: Diagnosis not present

## 2022-10-21 LAB — COMPREHENSIVE METABOLIC PANEL
ALT: 22 U/L (ref 0–44)
AST: 34 U/L (ref 15–41)
Albumin: 3.3 g/dL — ABNORMAL LOW (ref 3.5–5.0)
Alkaline Phosphatase: 70 U/L (ref 38–126)
Anion gap: 11 (ref 5–15)
BUN: 9 mg/dL (ref 6–20)
CO2: 23 mmol/L (ref 22–32)
Calcium: 8.9 mg/dL (ref 8.9–10.3)
Chloride: 101 mmol/L (ref 98–111)
Creatinine, Ser: 0.69 mg/dL (ref 0.44–1.00)
GFR, Estimated: >60 mL/min (ref >60)
Glucose, Bld: 90 mg/dL (ref 70–99)
Potassium: 4.1 mmol/L (ref 3.5–5.1)
Sodium: 135 mmol/L (ref 135–145)
Total Bilirubin: 0.6 mg/dL (ref 0.3–1.2)
Total Protein: 6.4 g/dL — ABNORMAL LOW (ref 6.5–8.1)

## 2022-10-21 LAB — DIC (DISSEMINATED INTRAVASCULAR COAGULATION)PANEL
D-Dimer, Quant: 0.41 ug/mL-FEU (ref 0.00–0.50)
Fibrinogen: 626 mg/dL — ABNORMAL HIGH (ref 210–475)
INR: 0.9 (ref 0.8–1.2)
Platelets: 252 10*3/uL (ref 150–400)
Prothrombin Time: 12.3 seconds (ref 11.4–15.2)
Smear Review: NONE SEEN
aPTT: 24 seconds (ref 24–36)

## 2022-10-21 LAB — RPR: RPR Ser Ql: NONREACTIVE

## 2022-10-21 LAB — CBC
HCT: 40.8 % (ref 36.0–46.0)
Hemoglobin: 13.7 g/dL (ref 12.0–15.0)
MCH: 32.3 pg (ref 26.0–34.0)
MCHC: 33.6 g/dL (ref 30.0–36.0)
MCV: 96.2 fL (ref 80.0–100.0)
Platelets: 261 10*3/uL (ref 150–400)
RBC: 4.24 MIL/uL (ref 3.87–5.11)
RDW: 12.1 % (ref 11.5–15.5)
WBC: 16 10*3/uL — ABNORMAL HIGH (ref 4.0–10.5)
nRBC: 0 % (ref 0.0–0.2)

## 2022-10-21 MED ORDER — LIDOCAINE HCL (PF) 1 % IJ SOLN
INTRAMUSCULAR | Status: DC | PRN
  Administered 2022-10-21: 5 mL via EPIDURAL

## 2022-10-21 MED ORDER — MISOPROSTOL 200 MCG PO TABS
200.0000 ug | ORAL_TABLET | Freq: Once | ORAL | Status: AC
  Administered 2022-10-21: 200 ug via ORAL

## 2022-10-21 MED ORDER — ONDANSETRON HCL 4 MG/2ML IJ SOLN
4.0000 mg | Freq: Four times a day (QID) | INTRAMUSCULAR | Status: DC | PRN
  Administered 2022-10-22: 4 mg via INTRAVENOUS
  Filled 2022-10-21: qty 2

## 2022-10-21 MED ORDER — EPHEDRINE 5 MG/ML INJ: 10.0000 mg | INTRAVENOUS | Status: DC | PRN

## 2022-10-21 MED ORDER — DIPHENHYDRAMINE HCL 50 MG/ML IJ SOLN: 12.5000 mg | INTRAMUSCULAR | Status: DC | PRN

## 2022-10-21 MED ORDER — OXYTOCIN-SODIUM CHLORIDE 30-0.9 UT/500ML-% IV SOLN: 1.0000 m[IU]/min | INTRAVENOUS | Status: DC

## 2022-10-21 MED ORDER — LACTATED RINGERS IV SOLN
500.0000 mL | Freq: Once | INTRAVENOUS | Status: AC
  Administered 2022-10-21: 500 mL via INTRAVENOUS

## 2022-10-21 MED ORDER — FENTANYL CITRATE (PF) 100 MCG/2ML IJ SOLN: 50.0000 ug | INTRAMUSCULAR | Status: DC | PRN

## 2022-10-21 MED ORDER — OXYCODONE-ACETAMINOPHEN 5-325 MG PO TABS: 1.0000 | ORAL_TABLET | ORAL | Status: DC | PRN

## 2022-10-21 MED ORDER — MISOPROSTOL 25 MCG QUARTER TABLET
25.0000 ug | ORAL_TABLET | Freq: Once | ORAL | Status: AC
  Administered 2022-10-21: 25 ug via VAGINAL
  Filled 2022-10-21: qty 1

## 2022-10-21 MED ORDER — LIDOCAINE HCL (PF) 1 % IJ SOLN
30.0000 mL | INTRAMUSCULAR | Status: AC | PRN
  Administered 2022-10-22: 5 mL via SUBCUTANEOUS
  Filled 2022-10-21: qty 30

## 2022-10-21 MED ORDER — OXYTOCIN-SODIUM CHLORIDE 30-0.9 UT/500ML-% IV SOLN
2.5000 [IU]/h | INTRAVENOUS | Status: DC
  Administered 2022-10-23: 2.5 [IU]/h via INTRAVENOUS

## 2022-10-21 MED ORDER — OXYTOCIN BOLUS FROM INFUSION
333.0000 mL | Freq: Once | INTRAVENOUS | Status: AC
  Administered 2022-10-23: 333 mL via INTRAVENOUS

## 2022-10-21 MED ORDER — MISOPROSTOL 200 MCG PO TABS
ORAL_TABLET | ORAL | Status: AC
  Administered 2022-10-21: 200 ug via VAGINAL
  Filled 2022-10-21: qty 1

## 2022-10-21 MED ORDER — FENTANYL-BUPIVACAINE-NACL 0.5-0.125-0.9 MG/250ML-% EP SOLN
EPIDURAL | Status: DC | PRN
  Administered 2022-10-21: 12 mL/h via EPIDURAL

## 2022-10-21 MED ORDER — ACETAMINOPHEN 325 MG PO TABS
650.0000 mg | ORAL_TABLET | ORAL | Status: DC | PRN
  Administered 2022-10-22 – 2022-10-23 (×2): 650 mg via ORAL
  Filled 2022-10-21 (×2): qty 2

## 2022-10-21 MED ORDER — MISOPROSTOL 200 MCG PO TABS: 200.0000 ug | ORAL_TABLET | Freq: Once | ORAL | Status: AC

## 2022-10-21 MED ORDER — LACTATED RINGERS IV SOLN: INTRAVENOUS | Status: DC

## 2022-10-21 MED ORDER — FENTANYL-BUPIVACAINE-NACL 0.5-0.125-0.9 MG/250ML-% EP SOLN
12.0000 mL/h | EPIDURAL | Status: DC | PRN
  Administered 2022-10-22 – 2022-10-23 (×2): 12 mL/h via EPIDURAL
  Filled 2022-10-21 (×3): qty 250

## 2022-10-21 MED ORDER — SOD CITRATE-CITRIC ACID 500-334 MG/5ML PO SOLN: 30.0000 mL | ORAL | Status: DC | PRN

## 2022-10-21 MED ORDER — PHENYLEPHRINE 80 MCG/ML (10ML) SYRINGE FOR IV PUSH (FOR BLOOD PRESSURE SUPPORT): 80.0000 ug | PREFILLED_SYRINGE | INTRAVENOUS | Status: DC | PRN

## 2022-10-21 MED ORDER — MISOPROSTOL 50MCG HALF TABLET
50.0000 ug | ORAL_TABLET | Freq: Once | ORAL | Status: AC
  Administered 2022-10-21: 50 ug via ORAL
  Filled 2022-10-21: qty 1

## 2022-10-21 MED ORDER — OXYCODONE-ACETAMINOPHEN 5-325 MG PO TABS: 2.0000 | ORAL_TABLET | ORAL | Status: DC | PRN

## 2022-10-21 MED ORDER — LACTATED RINGERS IV SOLN: 500.0000 mL | INTRAVENOUS | Status: DC | PRN

## 2022-10-21 NOTE — Anesthesia Procedure Notes (Signed)
Epidural Patient location during procedure: OB Start time: 10/21/2022 9:51 PM End time: 10/21/2022 10:07 PM  Staffing Anesthesiologist: Barnet Glasgow, MD Performed: anesthesiologist   Preanesthetic Checklist Completed: patient identified, IV checked, site marked, risks and benefits discussed, surgical consent, monitors and equipment checked, pre-op evaluation and timeout performed  Epidural Patient position: sitting Prep: DuraPrep and site prepped and draped Patient monitoring: continuous pulse ox and blood pressure Approach: midline Location: L3-L4 Injection technique: LOR air  Needle:  Needle type: Tuohy  Needle gauge: 17 G Needle length: 9 cm and 9 Needle insertion depth: 7 cm Catheter type: closed end flexible Catheter size: 19 Gauge Catheter at skin depth: 12 cm Test dose: negative  Assessment Events: blood not aspirated, no cerebrospinal fluid, injection not painful, no injection resistance, no paresthesia and negative IV test  Additional Notes Patient identified. Risks/Benefits/Options discussed with patient including but not limited to bleeding, infection, nerve damage, paralysis, failed block, incomplete pain control, headache, blood pressure changes, nausea, vomiting, reactions to medication both or allergic, itching and postpartum back pain. Confirmed with bedside nurse the patient's most recent platelet count. Confirmed with patient that they are not currently taking any anticoagulation, have any bleeding history or any family history of bleeding disorders. Patient expressed understanding and wished to proceed. All questions were answered. Sterile technique was used throughout the entire procedure. Please see nursing notes for vital signs. Test dose was given through epidural needle and negative prior to continuing to dose epidural or start infusion. Warning signs of high block given to the patient including shortness of breath, tingling/numbness in hands, complete  motor block, or any concerning symptoms with instructions to call for help. Patient was given instructions on fall risk and not to get out of bed. All questions and concerns addressed with instructions to call with any issues.  1 Attempt (S) . Patient tolerated procedure well.

## 2022-10-21 NOTE — Progress Notes (Signed)
Labor Progress Note  Nancy Benjamin is a 42 y.o. G2P0101 at 61w2dpresented for IOL for IUFD  S: Patient doing well.  She is being transferred for induction of labor for IUFD that was confirmed this morning by ultrasound.  Prefers an epidural sooner than later.  No fetal heart rate noted on repeat ultrasound.   O:  BP (!) 138/99   Pulse 83   Temp 98.4 F (36.9 C) (Oral)   Resp 20   Ht 5' (1.524 m)   Wt 77.6 kg   LMP 04/20/2022   SpO2 100%   BMI 33.40 kg/m  EFM: IUFD  CVE: 0 cm per prior exam  A&P: 42y.o. G2P0101 259w2dhere for IOL as above  #Labor: Cytotec 50/25 mcg p.o./vaginal #Pain: Family/Friend support and Epidural #FWB: CAT 1 #GBS unknown   JeShelda PalDO FMFairviewellow, Faculty practice CoDenisonor WoLong [facility] 10/21/22  9:49 AM

## 2022-10-21 NOTE — Progress Notes (Signed)
Labor Progress Note  Nancy Benjamin is a 42 y.o. G2P0101 at 23w2dpresented for IOL for IUFD  S: patient is doing well. No new concerns. She just got an epidural and is comfortable. Was feeling cramping about every 5 mins, prior to epidural.   O:  BP 135/64   Pulse 76   Temp 98.1 F (36.7 C) (Oral)   Resp 20   Ht 5' (1.524 m)   Wt 77.6 kg   LMP 04/20/2022   SpO2 98%   BMI 33.40 kg/m  EFM: IUFD  CVE: possibly ?4cm, slightly difficult check as fetal part noted to be protruding through the os.  A&P: 42y.o. G2P0101 215w2dhere for IOL as above  #Labor: IOL due to IUFD. S/p miso 50/25, followed by 2 doses of '200mg'$  PO. '200mg'$  just given vaginally. Continue to monitor. Encouraged patient to call for nurse if feeling pressure. #Pain: Family/Friend support and Epidural #FWB:  IUFD #GBS unknown   Verdon Ferrante J Sherrilyn RistMD FMNew Harmonyellow, Faculty practice CoFive Pointsor Wo[place]edar[place]2/24/24  11:40 PM

## 2022-10-21 NOTE — Anesthesia Preprocedure Evaluation (Addendum)
Anesthesia Evaluation  Patient identified by MRN, date of birth, ID band Patient awake    Reviewed: Allergy & Precautions, NPO status , Patient's Chart, lab work & pertinent test results  Airway Mallampati: II  TM Distance: >3 FB Neck ROM: Full    Dental no notable dental hx. (+) Teeth Intact, Dental Advisory Given   Pulmonary former smoker   Pulmonary exam normal breath sounds clear to auscultation       Cardiovascular hypertension, Normal cardiovascular exam Rhythm:Regular Rate:Normal     Neuro/Psych    GI/Hepatic   Endo/Other  diabetes    Renal/GU      Musculoskeletal   Abdominal   Peds  Hematology Lab Results      Component                Value               Date                      WBC                      PENDING             10/21/2022                HGB                      13.7                10/21/2022                HCT                      40.8                10/21/2022                MCV                      96.2                10/21/2022                PLT                      261                 10/21/2022              Anesthesia Other Findings   Reproductive/Obstetrics (+) Pregnancy                             Anesthesia Physical Anesthesia Plan  ASA: 3  Anesthesia Plan: Epidural   Post-op Pain Management:    Induction: Intravenous  PONV Risk Score and Plan:   Airway Management Planned:   Additional Equipment:   Intra-op Plan:   Post-operative Plan:   Informed Consent: I have reviewed the patients History and Physical, chart, labs and discussed the procedure including the risks, benefits and alternatives for the proposed anesthesia with the patient or authorized representative who has indicated his/her understanding and acceptance.     Dental advisory given  Plan Discussed with: CRNA and Anesthesiologist  Anesthesia Plan Comments: (26.2 Wk G2P1 Chtn,  IUGR for LEA for IUFD)       Anesthesia  Quick Evaluation

## 2022-10-21 NOTE — Progress Notes (Signed)
Patient would like to shower prior to transferring to labor and delivery for IOL. I asked her to call when she is ready to go to l & d.

## 2022-10-22 MED ORDER — MISOPROSTOL 200 MCG PO TABS
400.0000 ug | ORAL_TABLET | Freq: Once | ORAL | Status: AC
  Administered 2022-10-22: 400 ug via ORAL

## 2022-10-22 MED ORDER — ALUM & MAG HYDROXIDE-SIMETH 200-200-20 MG/5ML PO SUSP
30.0000 mL | Freq: Once | ORAL | Status: AC
  Administered 2022-10-22: 30 mL via ORAL
  Filled 2022-10-22: qty 30

## 2022-10-22 MED ORDER — CARBOPROST TROMETHAMINE 250 MCG/ML IM SOLN
INTRAMUSCULAR | Status: AC
  Filled 2022-10-22: qty 1

## 2022-10-22 MED ORDER — LABETALOL HCL 5 MG/ML IV SOLN
INTRAVENOUS | Status: AC
  Filled 2022-10-22: qty 4

## 2022-10-22 MED ORDER — DIPHENOXYLATE-ATROPINE 2.5-0.025 MG PO TABS
1.0000 | ORAL_TABLET | Freq: Four times a day (QID) | ORAL | Status: DC | PRN
  Administered 2022-10-22 – 2022-10-23 (×2): 1 via ORAL
  Filled 2022-10-22 (×2): qty 1

## 2022-10-22 MED ORDER — MISOPROSTOL 200 MCG PO TABS
200.0000 ug | ORAL_TABLET | Freq: Once | ORAL | Status: AC
  Administered 2022-10-22: 200 ug via VAGINAL
  Filled 2022-10-22: qty 1

## 2022-10-22 MED ORDER — CARBOPROST TROMETHAMINE 250 MCG/ML IM SOLN
2500.0000 ug | Freq: Once | INTRAMUSCULAR | Status: AC
  Administered 2022-10-22: 2500 ug via INTRAMUSCULAR

## 2022-10-22 MED ORDER — SODIUM CHLORIDE 0.9 % IV SOLN: 25.0000 mg | Freq: Once | INTRAVENOUS | Status: DC

## 2022-10-22 MED ORDER — LABETALOL HCL 5 MG/ML IV SOLN
10.0000 mg | Freq: Once | INTRAVENOUS | Status: AC
  Administered 2022-10-22: 10 mg via INTRAVENOUS

## 2022-10-22 MED ORDER — SODIUM CHLORIDE 0.9 % IV SOLN
25.0000 mg | INTRAVENOUS | Status: DC
  Filled 2022-10-22: qty 1

## 2022-10-22 NOTE — Op Note (Signed)
Pt is >24 hours with little cervical change from her cytotec, last 2 doses were 400 mcg orally  I talked with patient about proceeding with intra amniotic injection of 15 methyl prostaglandin F2alpha (hemabate 2500 mcg or 2.5 mg) to effect delivery.  Pt was open to doing that and since we were scheduled for the next dose she decided she wanted to try that at this point  I used the bedside sonogram and performed the sonogram marking an area just to the right of midline just above her c section scar where there was some amniotic fluid  Prepped with hibiclens 5 cc 2% lidocaine injected in the previously identified spot Amniocentesis needle was passed under direct sonographic guidance and into the amniotic cavily Clear straw colored amniotic fluid returned 10 vials of hemabate(2.5 mg or 2519mg was injected under direct visualization with turbulent intra amniotic flow noted Injected at 430 pm  Patient tolerated the procedure well  Now will wait for uterine activity  LFlorian Buff MD 10/22/2022 4:46 PM

## 2022-10-22 NOTE — Progress Notes (Signed)
Labor Progress Note  Nancy Benjamin is a 42 y.o. G2P0101 at 80w2dpresented for IOL for IUFD  S:  doing well. Not feeling contractions. Comfortable with epidural. Contractions about 2-5 mins apart on palpation. Last cytotec was about 6 hrs ago.  O:  BP (!) 149/80   Pulse 75   Temp 98.6 F (37 C) (Oral)   Resp 20   Ht 5' (1.524 m)   Wt 77.6 kg   LMP 04/20/2022   SpO2 98%   BMI 33.40 kg/m  EFM: IUFD  CVE: not checked. Does not feel any pressure.  A&P: 42y.o. G2P0101 236w2dhere for IOL as above  #Labor: IOL due to IUFD. S/p miso 50/25, '200mg'$  X 3doses. Repeat '200mg'$  vaginally and continue to monitor Encouraged patient to call for nurse if feeling pressure. #Pain: Family/Friend support and Epidural #FWB:  IUFD #GBS unknown   [name] J Sherrilyn RistMD FMSeymourellow, Faculty practice Co[place] Wo[place]2/25/24  5:32 AM

## 2022-10-23 ENCOUNTER — Encounter (HOSPITAL_COMMUNITY): Payer: Self-pay | Admitting: Obstetrics and Gynecology

## 2022-10-23 ENCOUNTER — Encounter: Payer: 59 | Admitting: Obstetrics & Gynecology

## 2022-10-23 ENCOUNTER — Other Ambulatory Visit: Payer: 59

## 2022-10-23 DIAGNOSIS — O09523 Supervision of elderly multigravida, third trimester: Secondary | ICD-10-CM | POA: Diagnosis not present

## 2022-10-23 DIAGNOSIS — O1002 Pre-existing essential hypertension complicating childbirth: Secondary | ICD-10-CM | POA: Diagnosis not present

## 2022-10-23 DIAGNOSIS — O1424 HELLP syndrome, complicating childbirth: Secondary | ICD-10-CM

## 2022-10-23 DIAGNOSIS — O365930 Maternal care for other known or suspected poor fetal growth, third trimester, not applicable or unspecified: Secondary | ICD-10-CM

## 2022-10-23 DIAGNOSIS — O34219 Maternal care for unspecified type scar from previous cesarean delivery: Secondary | ICD-10-CM | POA: Insufficient documentation

## 2022-10-23 DIAGNOSIS — O364XX0 Maternal care for intrauterine death, not applicable or unspecified: Secondary | ICD-10-CM | POA: Diagnosis not present

## 2022-10-23 DIAGNOSIS — Z3A26 26 weeks gestation of pregnancy: Secondary | ICD-10-CM

## 2022-10-23 DIAGNOSIS — O34211 Maternal care for low transverse scar from previous cesarean delivery: Secondary | ICD-10-CM | POA: Diagnosis not present

## 2022-10-23 LAB — COMPREHENSIVE METABOLIC PANEL
ALT: 113 U/L — ABNORMAL HIGH (ref 0–44)
ALT: 107 U/L — ABNORMAL HIGH (ref 0–44)
ALT: 94 U/L — ABNORMAL HIGH (ref 0–44)
AST: 100 U/L — ABNORMAL HIGH (ref 15–41)
AST: 84 U/L — ABNORMAL HIGH (ref 15–41)
AST: 71 U/L — ABNORMAL HIGH (ref 15–41)
Albumin: 2.7 g/dL — ABNORMAL LOW (ref 3.5–5.0)
Albumin: 2.7 g/dL — ABNORMAL LOW (ref 3.5–5.0)
Albumin: 2.7 g/dL — ABNORMAL LOW (ref 3.5–5.0)
Alkaline Phosphatase: 107 U/L (ref 38–126)
Alkaline Phosphatase: 114 U/L (ref 38–126)
Alkaline Phosphatase: 137 U/L — ABNORMAL HIGH (ref 38–126)
Anion gap: 9 (ref 5–15)
Anion gap: 8 (ref 5–15)
Anion gap: 11 (ref 5–15)
BUN: 6 mg/dL (ref 6–20)
BUN: 5 mg/dL — ABNORMAL LOW (ref 6–20)
BUN: <5 mg/dL — ABNORMAL LOW (ref 6–20)
CO2: 26 mmol/L (ref 22–32)
CO2: 27 mmol/L (ref 22–32)
CO2: 23 mmol/L (ref 22–32)
Calcium: 8.6 mg/dL — ABNORMAL LOW (ref 8.9–10.3)
Calcium: 8.6 mg/dL — ABNORMAL LOW (ref 8.9–10.3)
Calcium: 7.4 mg/dL — ABNORMAL LOW (ref 8.9–10.3)
Chloride: 99 mmol/L (ref 98–111)
Chloride: 100 mmol/L (ref 98–111)
Chloride: 97 mmol/L — ABNORMAL LOW (ref 98–111)
Creatinine, Ser: 0.67 mg/dL (ref 0.44–1.00)
Creatinine, Ser: 0.65 mg/dL (ref 0.44–1.00)
Creatinine, Ser: 0.65 mg/dL (ref 0.44–1.00)
GFR, Estimated: >60 mL/min (ref >60)
GFR, Estimated: >60 mL/min (ref >60)
GFR, Estimated: >60 mL/min (ref >60)
Glucose, Bld: 98 mg/dL (ref 70–99)
Glucose, Bld: 99 mg/dL (ref 70–99)
Glucose, Bld: 168 mg/dL — ABNORMAL HIGH (ref 70–99)
Potassium: 4 mmol/L (ref 3.5–5.1)
Potassium: 4.1 mmol/L (ref 3.5–5.1)
Potassium: 3.7 mmol/L (ref 3.5–5.1)
Sodium: 134 mmol/L — ABNORMAL LOW (ref 135–145)
Sodium: 135 mmol/L (ref 135–145)
Sodium: 131 mmol/L — ABNORMAL LOW (ref 135–145)
Total Bilirubin: 0.8 mg/dL (ref 0.3–1.2)
Total Bilirubin: 1 mg/dL (ref 0.3–1.2)
Total Bilirubin: 0.7 mg/dL (ref 0.3–1.2)
Total Protein: 5.7 g/dL — ABNORMAL LOW (ref 6.5–8.1)
Total Protein: 6 g/dL — ABNORMAL LOW (ref 6.5–8.1)
Total Protein: 5.9 g/dL — ABNORMAL LOW (ref 6.5–8.1)

## 2022-10-23 LAB — DIC (DISSEMINATED INTRAVASCULAR COAGULATION)PANEL
D-Dimer, Quant: 6.29 ug/mL-FEU — ABNORMAL HIGH (ref 0.00–0.50)
D-Dimer, Quant: >20 ug/mL-FEU — ABNORMAL HIGH (ref 0.00–0.50)
Fibrinogen: 606 mg/dL — ABNORMAL HIGH (ref 210–475)
Fibrinogen: 651 mg/dL — ABNORMAL HIGH (ref 210–475)
INR: 1.1 (ref 0.8–1.2)
INR: 1.1 (ref 0.8–1.2)
Platelets: 129 10*3/uL — ABNORMAL LOW (ref 150–400)
Platelets: 126 10*3/uL — ABNORMAL LOW (ref 150–400)
Prothrombin Time: 14 seconds (ref 11.4–15.2)
Prothrombin Time: 14.3 seconds (ref 11.4–15.2)
Smear Review: NONE SEEN
Smear Review: NONE SEEN
aPTT: 30 seconds (ref 24–36)
aPTT: 32 seconds (ref 24–36)

## 2022-10-23 LAB — MAGNESIUM
Magnesium: 4.2 mg/dL — ABNORMAL HIGH (ref 1.7–2.4)
Magnesium: 4.6 mg/dL — ABNORMAL HIGH (ref 1.7–2.4)

## 2022-10-23 LAB — CBC
HCT: 35 % — ABNORMAL LOW (ref 36.0–46.0)
HCT: 35.8 % — ABNORMAL LOW (ref 36.0–46.0)
HCT: 36 % (ref 36.0–46.0)
Hemoglobin: 11.9 g/dL — ABNORMAL LOW (ref 12.0–15.0)
Hemoglobin: 12.2 g/dL (ref 12.0–15.0)
Hemoglobin: 12.6 g/dL (ref 12.0–15.0)
MCH: 33.1 pg (ref 26.0–34.0)
MCH: 33.2 pg (ref 26.0–34.0)
MCH: 33.6 pg (ref 26.0–34.0)
MCHC: 34 g/dL (ref 30.0–36.0)
MCHC: 34.1 g/dL (ref 30.0–36.0)
MCHC: 35 g/dL (ref 30.0–36.0)
MCV: 97.5 fL (ref 80.0–100.0)
MCV: 97.3 fL (ref 80.0–100.0)
MCV: 96 fL (ref 80.0–100.0)
Platelets: 125 10*3/uL — ABNORMAL LOW (ref 150–400)
Platelets: 130 10*3/uL — ABNORMAL LOW (ref 150–400)
Platelets: 124 10*3/uL — ABNORMAL LOW (ref 150–400)
RBC: 3.59 MIL/uL — ABNORMAL LOW (ref 3.87–5.11)
RBC: 3.68 MIL/uL — ABNORMAL LOW (ref 3.87–5.11)
RBC: 3.75 MIL/uL — ABNORMAL LOW (ref 3.87–5.11)
RDW: 12.7 % (ref 11.5–15.5)
RDW: 12.5 % (ref 11.5–15.5)
RDW: 12.5 % (ref 11.5–15.5)
WBC: 17.7 10*3/uL — ABNORMAL HIGH (ref 4.0–10.5)
WBC: 20 10*3/uL — ABNORMAL HIGH (ref 4.0–10.5)
WBC: 27.8 10*3/uL — ABNORMAL HIGH (ref 4.0–10.5)
nRBC: 0 % (ref 0.0–0.2)
nRBC: 0 % (ref 0.0–0.2)
nRBC: 0 % (ref 0.0–0.2)

## 2022-10-23 LAB — PREPARE RBC (CROSSMATCH)

## 2022-10-23 MED ORDER — OXYTOCIN-SODIUM CHLORIDE 30-0.9 UT/500ML-% IV SOLN
1.0000 m[IU]/min | INTRAVENOUS | Status: DC
  Administered 2022-10-23: 8 m[IU]/min via INTRAVENOUS

## 2022-10-23 MED ORDER — SENNOSIDES-DOCUSATE SODIUM 8.6-50 MG PO TABS
2.0000 | ORAL_TABLET | Freq: Every day | ORAL | Status: DC
  Administered 2022-10-24: 2 via ORAL
  Filled 2022-10-23 (×2): qty 2

## 2022-10-23 MED ORDER — ONDANSETRON HCL 4 MG PO TABS: 4.0000 mg | ORAL_TABLET | ORAL | Status: DC | PRN

## 2022-10-23 MED ORDER — ACETAMINOPHEN 325 MG PO TABS: 650.0000 mg | ORAL_TABLET | ORAL | Status: DC | PRN

## 2022-10-23 MED ORDER — ZOLPIDEM TARTRATE 5 MG PO TABS
5.0000 mg | ORAL_TABLET | Freq: Every evening | ORAL | Status: DC | PRN
  Administered 2022-10-24: 5 mg via ORAL
  Filled 2022-10-23: qty 1

## 2022-10-23 MED ORDER — COCONUT OIL OIL: 1.0000 | TOPICAL_OIL | Status: DC | PRN

## 2022-10-23 MED ORDER — OXYTOCIN-SODIUM CHLORIDE 30-0.9 UT/500ML-% IV SOLN
1.0000 m[IU]/min | INTRAVENOUS | Status: DC
  Administered 2022-10-23: 1 m[IU]/min via INTRAVENOUS
  Filled 2022-10-23: qty 500

## 2022-10-23 MED ORDER — LACTATED RINGERS IV SOLN: INTRAVENOUS | Status: DC

## 2022-10-23 MED ORDER — LABETALOL HCL 5 MG/ML IV SOLN: 20.0000 mg | INTRAVENOUS | Status: DC | PRN

## 2022-10-23 MED ORDER — IBUPROFEN 600 MG PO TABS
600.0000 mg | ORAL_TABLET | Freq: Four times a day (QID) | ORAL | Status: DC
  Administered 2022-10-23 – 2022-10-24 (×5): 600 mg via ORAL
  Filled 2022-10-23 (×5): qty 1

## 2022-10-23 MED ORDER — DIBUCAINE (PERIANAL) 1 % EX OINT: 1.0000 | TOPICAL_OINTMENT | CUTANEOUS | Status: DC | PRN

## 2022-10-23 MED ORDER — DIPHENHYDRAMINE HCL 25 MG PO CAPS
25.0000 mg | ORAL_CAPSULE | Freq: Four times a day (QID) | ORAL | Status: DC | PRN
  Administered 2022-10-24: 25 mg via ORAL
  Filled 2022-10-23: qty 1

## 2022-10-23 MED ORDER — LABETALOL HCL 5 MG/ML IV SOLN: 80.0000 mg | INTRAVENOUS | Status: DC | PRN

## 2022-10-23 MED ORDER — BENZOCAINE-MENTHOL 20-0.5 % EX AERO: 1.0000 | INHALATION_SPRAY | CUTANEOUS | Status: DC | PRN

## 2022-10-23 MED ORDER — HYDRALAZINE HCL 20 MG/ML IJ SOLN: 10.0000 mg | INTRAMUSCULAR | Status: DC | PRN

## 2022-10-23 MED ORDER — TETANUS-DIPHTH-ACELL PERTUSSIS 5-2.5-18.5 LF-MCG/0.5 IM SUSY: 0.5000 mL | PREFILLED_SYRINGE | Freq: Once | INTRAMUSCULAR | Status: DC

## 2022-10-23 MED ORDER — WITCH HAZEL-GLYCERIN EX PADS: 1.0000 | MEDICATED_PAD | CUTANEOUS | Status: DC | PRN

## 2022-10-23 MED ORDER — PRENATAL MULTIVITAMIN CH: 1.0000 | ORAL_TABLET | Freq: Every day | ORAL | Status: DC

## 2022-10-23 MED ORDER — ONDANSETRON HCL 4 MG/2ML IJ SOLN: 4.0000 mg | INTRAMUSCULAR | Status: DC | PRN

## 2022-10-23 MED ORDER — MAGNESIUM SULFATE 40 GM/1000ML IV SOLN
1.0000 g/h | INTRAVENOUS | Status: AC
  Administered 2022-10-23 – 2022-10-24 (×2): 2 g/h via INTRAVENOUS
  Filled 2022-10-23 (×2): qty 1000

## 2022-10-23 MED ORDER — LABETALOL HCL 5 MG/ML IV SOLN: 40.0000 mg | INTRAVENOUS | Status: DC | PRN

## 2022-10-23 MED ORDER — MAGNESIUM SULFATE BOLUS VIA INFUSION
4.0000 g | Freq: Once | INTRAVENOUS | Status: AC
  Administered 2022-10-23: 4 g via INTRAVENOUS
  Filled 2022-10-23: qty 1000

## 2022-10-23 MED ORDER — SIMETHICONE 80 MG PO CHEW
80.0000 mg | CHEWABLE_TABLET | ORAL | Status: DC | PRN
  Administered 2022-10-24 (×2): 80 mg via ORAL
  Filled 2022-10-23 (×2): qty 1

## 2022-10-23 NOTE — Progress Notes (Signed)
Nancy Benjamin is a 42 y.o. G2P0101 at 49w4dby admitted for IOL in the setting of IUFD.  Subjective: Nancy Benjamin doing well given the circumstances. She is comfortable with epidural. She has no questions at this time. She does not wish to speak with chaplain.   Objective: BP 120/69   Pulse 75   Temp 98.2 F (36.8 C) (Oral)   Resp 18   Ht 5' (1.524 m)   Wt 77.6 kg   LMP 04/20/2022   SpO2 94%   BMI 33.40 kg/m  I/O last 3 completed shifts: In: -  Out: [id number][Urine:3950] Total I/O In: -  Out: 1200 [Urine:1200]  UC: none SVE:   Dilation: Fingertip Effacement (%): 80 Station: Ballotable Exam by:: DMaryagnes AmosCNM  Labs: Lab Results  Component Value Date   WBC 16.0 (H) 10/21/2022   HGB 13.7 10/21/2022   HCT 40.8 10/21/2022   MCV 96.2 10/21/2022   PLT 261 10/21/2022    Assessment / Plan: Nancy Kovalskyis a 42y.o. G2P0101 at 216w4ddmitted for IOL in the setting of IUFD  Labor: Cook catheter was placed successfully. 8064mlaced in uterine balloon. Patient tolerated procedure well. Plan is to AROM once foley is out.  Pain Control:  Epidural I/D:  GBS unknown  Anticipated MOD:  SVD   DanRenee HarderNM 10/23/2022, 10:37 AM

## 2022-10-23 NOTE — Progress Notes (Signed)
Noted to have a fever. Tyelenol '650mg'$  given, with resolution.  Continue to monitor.  Mikki Santee, MD

## 2022-10-23 NOTE — Discharge Summary (Signed)
Postpartum Discharge Summary  Date of Service updated: 10/25/22     Patient Name: Nancy Benjamin DOB: 02/26/1981 MRN: [id number]  Date of admission: 10/16/2022 Delivery date:10/23/2022  Delivering provider: Renee Harder  Date of discharge: 10/25/2022  Admitting diagnosis: Fetal growth restriction antepartum [O36.5990] Intrauterine pregnancy: [redacted]w[redacted]d    Secondary diagnosis:  Principal Problem:   Fetal growth restriction antepartum Active Problems:   History of cesarean delivery   Supervision of high-risk pregnancy   Chronic hypertension affecting pregnancy   Hx of preeclampsia, prior pregnancy, currently pregnant   [redacted] weeks gestation of pregnancy   VBAC (vaginal birth after Cesarean)  Additional problems: HELLP    Discharge diagnosis: Preterm Pregnancy Delivered, VBAC, and CHTN                                              Post partum procedures: none Augmentation: Intra-amniotic hemabate, Pitocin, Cytotec, and IP [facility]: Induction of Labor With Vaginal Delivery   42y.o. yo G2P0101 at 230w4das admitted to the hospital 10/16/2022 for induction of labor.  Indication for induction: IUFD .  Patient had an labor course complicated by IUFD Membrane Rupture Time/Date: 6:46 PM ,10/23/2022   Delivery Method:Vaginal, Spontaneous  Episiotomy: None  Lacerations:  None  Details of delivery can be found in separate delivery note.  Patient had a postpartum course complicated by magnesium sulfate recovery and some minimal mood changes. Patient was seen by psychiatry the morning of 10/25/22 and was deemed stable for discharge with outpatient community follow up. Patient is discharged home 10/25/22.  Newborn Data: Birth date:10/23/2022  Birth time:6:46 PM  Gender:Female  Living status:Fetal Demise  Apgars: ,  Weight:340 g   Magnesium Sulfate received: Yes: Seizure prophylaxis BMZ received: No Rhophylac:N/A MMR:No T-DaP: unknown Flu:  No Transfusion:No  Physical exam  Vitals:   10/24/22 1946 10/24/22 1951 10/24/22 2249 10/25/22 0759  BP: (!) 105/55 (!) 105/55 138/87 (!) 144/79  Pulse: 75  73 78  Resp: '17  18 17  '$ Temp: 97.8 F (36.6 C)  98.2 F (36.8 C) 98.2 F (36.8 C)  TempSrc: Oral  Oral Oral  SpO2: 96%  97% 100%  Weight:      Height:       General: alert, cooperative, and no distress Lochia: appropriate Uterine Fundus: firm Incision: N/A DVT Evaluation: No evidence of DVT seen on physical exam. Negative Homan's sign. Labs: Lab Results  Component Value Date   WBC 14.1 (H) 10/25/2022   HGB 12.4 10/25/2022   HCT 36.4 10/25/2022   MCV 97.8 10/25/2022   PLT 171 10/25/2022      Latest Ref Rng & Units 10/25/2022    4:21 AM  CMP  Glucose 70 - 99 mg/dL 95   BUN 6 - 20 mg/dL 5   Creatinine 0.44 - 1.00 mg/dL 0.64   Sodium 135 - 145 mmol/L 140   Potassium 3.5 - 5.1 mmol/L 3.7   Chloride 98 - 111 mmol/L 108   CO2 22 - 32 mmol/L 22   Calcium 8.9 - 10.3 mg/dL 8.7   Total Protein 6.5 - 8.1 g/dL 6.6   Total Bilirubin 0.3 - 1.2 mg/dL 0.3   Alkaline Phos 38 - 126 U/L 151   AST 15 - 41 U/L 34   ALT 0 - 44 U/L 65  Edinburgh Score:    06/21/2020   11:25 AM  Edinburgh Postnatal Depression Scale Screening Tool  I have been able to laugh and see the funny side of things. 0  I have looked forward with enjoyment to things. 0  I have blamed myself unnecessarily when things went wrong. 1  I have been anxious or worried for no good reason. 0  I have felt scared or panicky for no good reason. 0  Things have been getting on top of me. 1  I have been so unhappy that I have had difficulty sleeping. 0  I have felt sad or miserable. 1  I have been so unhappy that I have been crying. 0  The thought of harming myself has occurred to me. 0  Edinburgh Postnatal Depression Scale Total 3     After visit meds:  Allergies as of 10/25/2022       Reactions   Nickel Rash   Other Rash   Robitussin          Medication List     STOP taking these medications    arginine 500 MG tablet   aspirin EC 81 MG tablet   guaiFENesin 600 MG 12 hr tablet Commonly known as: MUCINEX       TAKE these medications    acetaminophen 325 MG tablet Commonly known as: Tylenol Take 2 tablets (650 mg total) by mouth every 4 (four) hours as needed (for pain scale < 4).   cholecalciferol 25 MCG (1000 UNIT) tablet Commonly known as: VITAMIN D3 Take 2,000 Units by mouth daily.   enalapril 5 MG tablet Commonly known as: VASOTEC Take 1 tablet (5 mg total) by mouth daily.   furosemide 20 MG tablet Commonly known as: LASIX Take 1 tablet (20 mg total) by mouth 2 (two) times daily for 5 days.   hydrOXYzine 25 MG tablet Commonly known as: ATARAX Take 1 tablet (25 mg total) by mouth 3 (three) times daily as needed for anxiety. What changed: when to take this   labetalol 200 MG tablet Commonly known as: NORMODYNE Take 3 tablets (600 mg total) by mouth 3 (three) times daily.   pravastatin 40 MG tablet Commonly known as: Pravachol Take 1 tablet (40 mg total) by mouth daily.   PRENATAL VITAMIN/MIN +DHA PO   PriLOSEC OTC 20 MG tablet Generic drug: omeprazole Take 20 mg by mouth daily.   sertraline 25 MG tablet Commonly known as: Zoloft Take 1 tablet (25 mg total) by mouth daily.   ZyrTEC Allergy 10 MG tablet Generic drug: cetirizine         Discharge home in stable condition Infant Feeding:  not applicable Infant Disposition:morgue Discharge instruction: per After Visit Summary and Postpartum booklet. Activity: Advance as tolerated. Pelvic rest for 6 weeks.  Diet: low salt diet Future Appointments: Future Appointments  Date Time Provider [place]  10/31/2022  2:50 PM Roma Schanz, [place] [facility]  11/28/2022 10:30 AM Eure, Mertie Clause, MD [facility]   Follow up Visit:  [facility] [place] [facility]. Schedule an  appointment as soon as possible for a visit in 1 week(s).   Specialty: Obstetrics and Gynecology Why: BP check and well being check after fetal loss Contact information: [address] [place] [phone]                 Please schedule this patient for a In person  postpartum visit in 4 weeks with the following provider:  Dr. Elonda Husky Additional Postpartum F/U:Postpartum Depression checkup and BP check 1 week  High risk pregnancy complicated by: HTN, HELLP, IUFD Delivery mode:  Vaginal, Spontaneous  Anticipated Birth Control:  OCPs   10/25/2022 Griffin Basil, MD

## 2022-10-23 NOTE — Progress Notes (Signed)
Nancy Benjamin is a 42 y.o. G2P0101 at 44w4dby admitted for IOL in the setting of IUFD.  Subjective: Patient updated on labs  Objective: BP 124/80   Pulse 83   Temp 98.2 F (36.8 C) (Oral)   Resp 16   Ht 5' (1.524 m)   Wt 77.6 kg   LMP 04/20/2022   SpO2 96%   BMI 33.40 kg/m  I/O last 3 completed shifts: In: -  Out: [id number][Urine:3950] Total I/O In: 50 [P.O.:50] Out: 1320 [Urine:1320]  UC: none SVE:   Dilation: Fingertip Effacement (%): 80 Station: Ballotable Exam by:: DMaryagnes AmosCNM  Labs: Lab Results  Component Value Date   WBC 17.7 (H) 10/23/2022   HGB 11.9 (L) 10/23/2022   HCT 35.0 (L) 10/23/2022   MCV 97.5 10/23/2022   PLT 125 (L) 10/23/2022    Assessment / Plan: [name] a 42y.o. G2P0101 at 237w4ddmitted for IOL in the setting of IUFD  Labor: Cook catheter still in place cHTN: Most recent labs concerning for HELLP. BP's normotensive. Patient asymptomatic. Will start mag sulfate. Repeat labs Q6H. Continue labetalol 600 TID. Pain Control:  Epidural I/D:  GBS unknown  Anticipated MOD:  SVD   DaRenee HarderCNM 10/23/2022, 1:44 PM

## 2022-10-23 NOTE — Progress Notes (Signed)
Nancy Benjamin is a 42 y.o. G2P0101 at 32w4dby admitted for IOL in the setting of IUFD.  Subjective: Nancy Benjamin reporting occasional mild cramping, otherwise is comfortable with epidural.   Objective: BP 113/70   Pulse 97   Temp 98.2 F (36.8 C) (Oral)   Resp 16   Ht 5' (1.524 m)   Wt 77.6 kg   LMP 04/20/2022   SpO2 94%   BMI 33.40 kg/m  I/O last 3 completed shifts: In: -  Out: [id number][Urine:3950] Total I/O In: 100 [P.O.:100] Out: 2420 [Urine:2420]  UC: none SVE:   Dilation: Fingertip Effacement (%): 80 Station: Ballotable Exam by:: DMaryagnes AmosCNM  Labs: Lab Results  Component Value Date   WBC 20.0 (H) 10/23/2022   HGB 12.2 10/23/2022   HCT 35.8 (L) 10/23/2022   MCV 97.3 10/23/2022   PLT 130 (L) 10/23/2022    Assessment / Plan: Nancy Melkais a 42y.o. G2P0101 at 226w4ddmitted for IOL in the setting of IUFD  Labor: Cook catheter still in place. Per d/w MD, will increase Pitocin to 4/4 up to 2035mcHTN: BPs stable. Patient remains asymptomatic. Continue mag infusion. Repeat labs Q6H. Continue labetalol 600 TID. Pain Control:  Epidural I/D:  GBS unknown  Anticipated MOD:  SVD   DanRenee HarderNM 10/23/2022, 4:42 PM

## 2022-10-24 ENCOUNTER — Encounter (HOSPITAL_COMMUNITY): Payer: Self-pay | Admitting: Obstetrics and Gynecology

## 2022-10-24 LAB — CBC
HCT: 33.9 % — ABNORMAL LOW (ref 36.0–46.0)
Hemoglobin: 11.3 g/dL — ABNORMAL LOW (ref 12.0–15.0)
MCH: 32.9 pg (ref 26.0–34.0)
MCHC: 33.3 g/dL (ref 30.0–36.0)
MCV: 98.8 fL (ref 80.0–100.0)
Platelets: 130 10*3/uL — ABNORMAL LOW (ref 150–400)
RBC: 3.43 MIL/uL — ABNORMAL LOW (ref 3.87–5.11)
RDW: 12.6 % (ref 11.5–15.5)
WBC: 21.2 10*3/uL — ABNORMAL HIGH (ref 4.0–10.5)
nRBC: 0 % (ref 0.0–0.2)

## 2022-10-24 LAB — COMPREHENSIVE METABOLIC PANEL
ALT: 76 U/L — ABNORMAL HIGH (ref 0–44)
AST: 49 U/L — ABNORMAL HIGH (ref 15–41)
Albumin: 2.5 g/dL — ABNORMAL LOW (ref 3.5–5.0)
Alkaline Phosphatase: 133 U/L — ABNORMAL HIGH (ref 38–126)
Anion gap: 11 (ref 5–15)
BUN: <5 mg/dL — ABNORMAL LOW (ref 6–20)
CO2: 25 mmol/L (ref 22–32)
Calcium: 7.1 mg/dL — ABNORMAL LOW (ref 8.9–10.3)
Chloride: 99 mmol/L (ref 98–111)
Creatinine, Ser: 0.61 mg/dL (ref 0.44–1.00)
GFR, Estimated: >60 mL/min (ref >60)
Glucose, Bld: 133 mg/dL — ABNORMAL HIGH (ref 70–99)
Potassium: 3.5 mmol/L (ref 3.5–5.1)
Sodium: 135 mmol/L (ref 135–145)
Total Bilirubin: 0.6 mg/dL (ref 0.3–1.2)
Total Protein: 5.8 g/dL — ABNORMAL LOW (ref 6.5–8.1)

## 2022-10-24 MED ORDER — ENALAPRIL MALEATE 2.5 MG PO TABS
5.0000 mg | ORAL_TABLET | Freq: Every day | ORAL | Status: DC
  Administered 2022-10-25: 5 mg via ORAL
  Filled 2022-10-24 (×2): qty 2

## 2022-10-24 MED ORDER — PANTOPRAZOLE SODIUM 40 MG PO TBEC
40.0000 mg | DELAYED_RELEASE_TABLET | Freq: Every day | ORAL | Status: DC
  Administered 2022-10-24 – 2022-10-25 (×2): 40 mg via ORAL
  Filled 2022-10-24 (×2): qty 1

## 2022-10-24 MED ORDER — SERTRALINE HCL 50 MG PO TABS
25.0000 mg | ORAL_TABLET | Freq: Every day | ORAL | Status: DC
  Administered 2022-10-24 – 2022-10-25 (×2): 25 mg via ORAL
  Filled 2022-10-24 (×2): qty 1

## 2022-10-24 MED ORDER — LORATADINE 10 MG PO TABS
10.0000 mg | ORAL_TABLET | Freq: Every day | ORAL | Status: DC
  Administered 2022-10-24 – 2022-10-25 (×2): 10 mg via ORAL
  Filled 2022-10-24 (×2): qty 1

## 2022-10-24 MED ORDER — FUROSEMIDE 20 MG PO TABS
20.0000 mg | ORAL_TABLET | Freq: Two times a day (BID) | ORAL | Status: DC
  Administered 2022-10-25: 20 mg via ORAL
  Filled 2022-10-24: qty 1

## 2022-10-24 NOTE — Progress Notes (Signed)
Post Partum Day 1 IUFD at 26 weeks with SPEC and HELLP Subjective: Magnesium is making her feel bad. Otherwise no complaints  Objective: Blood pressure 128/80, pulse 75, temperature 97.8 F (36.6 C), temperature source Oral, resp. rate 16, height 5' (1.524 m), weight 77.6 kg, last menstrual period 04/20/2022, SpO2 95 %.  Physical Exam:  General: alert Lochia: appropriate Uterine Fundus: firm Incision: NA DVT Evaluation: No evidence of DVT seen on physical exam.  Recent Labs    10/23/22 1957 10/24/22 0511  HGB 12.6 11.3*  HCT 36.0 33.9*    Assessment/Plan: Will decrease magnesium to 1 gm/hr. BP stable without meds. HELLP labs improving. Continue with current management   LOS: 8 days   Chancy Milroy, MD 10/24/2022, 7:54 AM

## 2022-10-24 NOTE — Anesthesia Postprocedure Evaluation (Signed)
Anesthesia Post Note  Patient: Nancy Benjamin  Procedure(s) Performed: AN AD HOC LABOR EPIDURAL     Patient location during evaluation: Mother Baby Anesthesia Type: Epidural Level of consciousness: awake and alert and oriented Pain management: satisfactory to patient Vital Signs Assessment: post-procedure vital signs reviewed and stable Respiratory status: respiratory function stable Cardiovascular status: stable Postop Assessment: no headache, no backache, epidural receding, patient able to bend at knees, no signs of nausea or vomiting, adequate PO intake and able to ambulate Anesthetic complications: no   No notable events documented.  Last Vitals:  Vitals:   10/24/22 0743 10/24/22 0759  BP: 128/80 118/73  Pulse: 75 73  Resp: 16 17  Temp: 36.6 C 36.6 C  SpO2: 95% 97%    Last Pain:  Vitals:   10/24/22 0759  TempSrc: Oral  PainSc: 0-No pain   Pain Goal:                   [name]

## 2022-10-25 ENCOUNTER — Other Ambulatory Visit (HOSPITAL_COMMUNITY): Payer: Self-pay

## 2022-10-25 DIAGNOSIS — G4701 Insomnia due to medical condition: Secondary | ICD-10-CM

## 2022-10-25 DIAGNOSIS — F4321 Adjustment disorder with depressed mood: Secondary | ICD-10-CM

## 2022-10-25 LAB — CBC WITH DIFFERENTIAL/PLATELET
Abs Immature Granulocytes: 0.07 10*3/uL (ref 0.00–0.07)
Basophils Absolute: 0.1 10*3/uL (ref 0.0–0.1)
Basophils Relative: 0 %
Eosinophils Absolute: 0.4 10*3/uL (ref 0.0–0.5)
Eosinophils Relative: 3 %
HCT: 36.4 % (ref 36.0–46.0)
Hemoglobin: 12.4 g/dL (ref 12.0–15.0)
Immature Granulocytes: 1 %
Lymphocytes Relative: 17 %
Lymphs Abs: 2.3 10*3/uL (ref 0.7–4.0)
MCH: 33.3 pg (ref 26.0–34.0)
MCHC: 34.1 g/dL (ref 30.0–36.0)
MCV: 97.8 fL (ref 80.0–100.0)
Monocytes Absolute: 0.8 10*3/uL (ref 0.1–1.0)
Monocytes Relative: 5 %
Neutro Abs: 10.5 10*3/uL — ABNORMAL HIGH (ref 1.7–7.7)
Neutrophils Relative %: 74 %
Platelets: 171 10*3/uL (ref 150–400)
RBC: 3.72 MIL/uL — ABNORMAL LOW (ref 3.87–5.11)
RDW: 12.7 % (ref 11.5–15.5)
WBC: 14.1 10*3/uL — ABNORMAL HIGH (ref 4.0–10.5)
nRBC: 0 % (ref 0.0–0.2)

## 2022-10-25 LAB — COMPREHENSIVE METABOLIC PANEL
ALT: 65 U/L — ABNORMAL HIGH (ref 0–44)
AST: 34 U/L (ref 15–41)
Albumin: 3 g/dL — ABNORMAL LOW (ref 3.5–5.0)
Alkaline Phosphatase: 151 U/L — ABNORMAL HIGH (ref 38–126)
Anion gap: 10 (ref 5–15)
BUN: 5 mg/dL — ABNORMAL LOW (ref 6–20)
CO2: 22 mmol/L (ref 22–32)
Calcium: 8.7 mg/dL — ABNORMAL LOW (ref 8.9–10.3)
Chloride: 108 mmol/L (ref 98–111)
Creatinine, Ser: 0.64 mg/dL (ref 0.44–1.00)
GFR, Estimated: >60 mL/min (ref >60)
Glucose, Bld: 95 mg/dL (ref 70–99)
Potassium: 3.7 mmol/L (ref 3.5–5.1)
Sodium: 140 mmol/L (ref 135–145)
Total Bilirubin: 0.3 mg/dL (ref 0.3–1.2)
Total Protein: 6.6 g/dL (ref 6.5–8.1)

## 2022-10-25 LAB — SURGICAL PATHOLOGY

## 2022-10-25 MED ORDER — HYDROXYZINE HCL 25 MG PO TABS
25.0000 mg | ORAL_TABLET | Freq: Three times a day (TID) | ORAL | 0 refills | Status: AC | PRN
  Filled 2022-10-25: qty 30, 10d supply, fill #0

## 2022-10-25 MED ORDER — HYDROXYZINE HCL 50 MG PO TABS
25.0000 mg | ORAL_TABLET | Freq: Three times a day (TID) | ORAL | Status: DC | PRN
  Administered 2022-10-25: 25 mg via ORAL
  Filled 2022-10-25: qty 1

## 2022-10-25 MED ORDER — ENALAPRIL MALEATE 5 MG PO TABS
5.0000 mg | ORAL_TABLET | Freq: Every day | ORAL | 0 refills | Status: DC
  Filled 2022-10-25: qty 30, 30d supply, fill #0

## 2022-10-25 MED ORDER — ACETAMINOPHEN 325 MG PO TABS: 650.0000 mg | ORAL_TABLET | ORAL | Status: AC | PRN

## 2022-10-25 MED ORDER — FUROSEMIDE 20 MG PO TABS
20.0000 mg | ORAL_TABLET | Freq: Two times a day (BID) | ORAL | 0 refills | Status: DC
  Filled 2022-10-25: qty 10, 5d supply, fill #0

## 2022-10-25 NOTE — Progress Notes (Signed)
Discharge instructions and prescriptions given to pt. Discussed post vaginal delivery care, signs and symptoms to report to the MD, upcoming appointments, and meds.Pt verbalizes understanding and has no questions or concerns at this time. Pt discharged home from hospital in stable condition.

## 2022-10-25 NOTE — Consult Note (Signed)
[facility] New Face-to-Face Psychiatric Evaluation   Name: Nancy Benjamin DOB: 1981/08/12 MRN: [id number] Service Date: October 25, 2022 LOS:  LOS: 9 days     Assessment  Nancy Benjamin is a 42 y.o. female admitted medically for 10/16/2022  4:11 PM for induction of labor with vaginal delivery, complicated by fetal demise and HELLP syndrome. She carries the psychiatric diagnoses of depression and anxiety and has a past medical history of HTN.Psychiatry was consulted for "postpartum anxiety/psychosis" by Janyth Pupa, DO.    Her current presentation is most consistent with hypnagogic hallucinations after 24 hours without sleep, worsened by Ambien.  Patient reports hearing buzzing and music playing "Life Goes On" by Lenn Cal.  She does not remember if her Pandora continue to play or if she heard the sound playing in her head.  This is the first time that she ever experienced anything like this, including during this hospitalization.  She does not meet criteria for inpatient psychiatric hospitalization based on patient not being a danger to herself nor to others, and without true psychosis.  Current outpatient psychotropic medications include Zoloft 25 mg daily and historically she has had a positive response to these medications. She was compliant with medications prior to admission as reported.   Please see plan below for detailed recommendations.   Diagnoses:  Active Hospital problems: Principal Problem:   Fetal growth restriction antepartum Active Problems:   History of cesarean delivery   Chronic hypertension affecting pregnancy   Hx of preeclampsia, prior pregnancy, currently pregnant   [redacted] weeks gestation of pregnancy   VBAC (vaginal birth after Cesarean)     Plan  ## Safety and Observation Level:  - Based on my clinical evaluation, I estimate the patient to be at minimal risk of self harm in the current setting - At this time, we recommend a standard level of  observation. This decision is based on my review of the chart including patient's history and current presentation, interview of the patient, mental status examination, and consideration of suicide risk including evaluating suicidal ideation, plan, intent, suicidal or self-harm behaviors, risk factors, and protective factors. This judgment is based on our ability to directly address suicide risk, implement suicide prevention strategies and develop a safety plan while the patient is in the clinical setting. Please contact our team if there is a concern that risk level has changed.   ## Medications:  -- Continue Zoloft 25 mg daily -- Continue hydroxyzine 25 or 50 mg nightly as needed sleep -- Follow-up with PCP -- TOC consult placed for grief and therapy resources in the [place], [place] area  ## Medical Decision Making Capacity:  Not formally assessed  ## Further Work-up:  -- Pertinent labwork reviewed earlier this admission includes: ALT/AST 113/100 after delivery.  Platelets decreased to 124-130 after delivery  ## Disposition:  -- Discharge, per primary team  ## Behavioral / Environmental:  -- Standard  ##Legal Status Voluntary  Thank you for this consult request. Recommendations have been communicated to the primary team.  We will sign off at this time.   Rosezetta Schlatter, MD   NEW history  Relevant Aspects of Hospital Course:  Admitted on 10/16/2022 for induction of labor.  Patient Report:  Patient is tearful about losing her son.  She reports after delivering, she could not sleep for 24 hours.  She previously had no problems sleeping.  Last night, while alone, patient heard buzzing and a song on repeat.  The song was "Life Goes On."  After hearing the sounds, patient was given a dose of Ambien, and the sounds amplified.  She was given a dose of hydroxyzine as well, and the colors on the television began to jump out at the patient.  As well, she experienced an increase in dizziness and  tremulousness that was reported earlier with magnesium infusion.  This morning, patient still endorses some tremulousness, but denies any hallucinations.  Upon clarification, patient reports that she did hear the buzzing prior to receiving the Ambien dose and falling asleep, but she does not remember falling asleep afterward.  She states that she may have been dreaming.  Patient otherwise denies depressive symptoms prior to delivery including postpartum depression with her previous pregnancy.  She does not have a history of bipolar disorder and endorses no previous manic or hypomanic symptoms.  She denies history of trauma producing nightmares, flashbacks, hypervigilance.  Collateral information:  Not obtained today  Psychiatric History:  Information collected from patient, chart  Family psych history: Denies   Social History:  Tobacco use: Denies Alcohol use: Denies Drug use: Denies  Family History:  The patient's family history includes Asthma in her father; Diabetes in her mother; Heart attack in her maternal grandfather; Hypertension in her father; Irritable bowel syndrome in her mother; Prostate cancer in her father and paternal grandfather.  Medical History: Past Medical History:  Diagnosis Date   Diabetes mellitus without complication ([place])    gestational   Hypertension    patient reports htn since "mid 20's"    Surgical History: Past Surgical History:  Procedure Laterality Date   CESAREAN SECTION N/A 05/01/2020   Procedure: CESAREAN SECTION;  Surgeon: Chancy Milroy, MD;  Location: [facility] LD ORS;  Service: Obstetrics;  Laterality: N/A;   WISDOM TOOTH EXTRACTION      Medications:   Current Facility-Administered Medications:    acetaminophen (TYLENOL) tablet 650 mg, 650 mg, Oral, Q4H PRN, Simpson, Danielle L, CNM   benzocaine-Menthol (DERMOPLAST) 20-0.5 % topical spray 1 Application, 1 Application, Topical, PRN, Simpson, Danielle L, CNM   coconut oil, 1 Application,  Topical, PRN, Simpson, Danielle L, CNM   witch hazel-glycerin (TUCKS) pad 1 Application, 1 Application, Topical, PRN **AND** dibucaine (NUPERCAINAL) 1 % rectal ointment 1 Application, 1 Application, Rectal, PRN, Simpson, Danielle L, CNM   diphenhydrAMINE (BENADRYL) capsule 25 mg, 25 mg, Oral, Q6H PRN, Simpson, Danielle L, CNM, 25 mg at 10/24/22 2252   enalapril (VASOTEC) tablet 5 mg, 5 mg, Oral, Daily, Griffin Basil, MD, 5 mg at 10/25/22 1106   furosemide (LASIX) tablet 20 mg, 20 mg, Oral, BID, Griffin Basil, MD, 20 mg at 10/25/22 0845   labetalol (NORMODYNE) injection 20 mg, 20 mg, Intravenous, PRN **AND** labetalol (NORMODYNE) injection 40 mg, 40 mg, Intravenous, PRN **AND** labetalol (NORMODYNE) injection 80 mg, 80 mg, Intravenous, PRN **AND** hydrALAZINE (APRESOLINE) injection 10 mg, 10 mg, Intravenous, PRN **AND** Measure blood pressure, , , Once, Simpson, Danielle L, CNM   hydrOXYzine (ATARAX) tablet 25 mg, 25 mg, Oral, TID PRN, Janyth Pupa, DO, 25 mg at 10/25/22 0202   ibuprofen (ADVIL) tablet 600 mg, 600 mg, Oral, Q6H, Simpson, Danielle L, CNM, 600 mg at 10/24/22 2351   labetalol (NORMODYNE) tablet 600 mg, 600 mg, Oral, TID, Simpson, Danielle L, CNM, 600 mg at 10/25/22 1107   lactated ringers infusion, , Intravenous, Continuous, Simpson, Danielle L, CNM, Stopped at 10/24/22 1400   loratadine (CLARITIN) tablet 10 mg, 10 mg, Oral, Daily, Griffin Basil, MD, 10 mg at 10/25/22 1107  ondansetron (ZOFRAN) tablet 4 mg, 4 mg, Oral, Q4H PRN **OR** ondansetron (ZOFRAN) injection 4 mg, 4 mg, Intravenous, Q4H PRN, Simpson, Danielle L, CNM   pantoprazole (PROTONIX) EC tablet 40 mg, 40 mg, Oral, Daily, Griffin Basil, MD, 40 mg at 10/25/22 1105   prenatal multivitamin tablet 1 tablet, 1 tablet, Oral, Q1200, Simpson, Danielle L, CNM   senna-docusate (Senokot-S) tablet 2 tablet, 2 tablet, Oral, Daily, Simpson, Danielle L, CNM, 2 tablet at 10/24/22 0900   sertraline (ZOLOFT) tablet 25 mg, 25 mg,  Oral, Daily, Griffin Basil, MD, 25 mg at 10/25/22 1106   simethicone (MYLICON) chewable tablet 80 mg, 80 mg, Oral, PRN, Simpson, Danielle L, CNM, 80 mg at 10/24/22 2252   Tdap (BOOSTRIX) injection 0.5 mL, 0.5 mL, Intramuscular, Once, Simpson, Danielle L, CNM   zolpidem (AMBIEN) tablet 5 mg, 5 mg, Oral, QHS PRN, Moshe Cipro, Danielle L, CNM, 5 mg at 10/24/22 2351  Current Outpatient Medications:    acetaminophen (TYLENOL) 325 MG tablet, Take 2 tablets (650 mg total) by mouth every 4 (four) hours as needed (for pain scale < 4)., Disp: , Rfl:    cetirizine (ZYRTEC ALLERGY) 10 MG tablet, , Disp: , Rfl:    cholecalciferol (VITAMIN D3) 25 MCG (1000 UNIT) tablet, Take 2,000 Units by mouth daily., Disp: , Rfl:    enalapril (VASOTEC) 5 MG tablet, Take 1 tablet (5 mg total) by mouth daily., Disp: 30 tablet, Rfl: 0   furosemide (LASIX) 20 MG tablet, Take 1 tablet (20 mg total) by mouth 2 (two) times daily for 5 days., Disp: 10 tablet, Rfl: 0   hydrOXYzine (ATARAX) 25 MG tablet, Take 1 tablet (25 mg total) by mouth 3 (three) times daily as needed for anxiety., Disp: 30 tablet, Rfl: 0   labetalol (NORMODYNE) 200 MG tablet, Take 3 tablets (600 mg total) by mouth 3 (three) times daily., Disp: 270 tablet, Rfl: 6   omeprazole (PRILOSEC OTC) 20 MG tablet, Take 20 mg by mouth daily., Disp: , Rfl:    pravastatin (PRAVACHOL) 40 MG tablet, Take 1 tablet (40 mg total) by mouth daily., Disp: 30 tablet, Rfl: 11   Prenatal Vit-Fe Sulfate-FA-DHA (PRENATAL VITAMIN/MIN +DHA PO), , Disp: , Rfl:    sertraline (ZOLOFT) 25 MG tablet, Take 1 tablet (25 mg total) by mouth daily., Disp: 30 tablet, Rfl: 1  Allergies: Allergies  Allergen Reactions   Nickel Rash   Other Rash    Robitussin        Objective  Vital signs:  Temp:  [97.8 F (36.6 C)-98.2 F (36.8 C)] 98 F (36.7 C) (02/28 1241) Pulse Rate:  [73-89] 83 (02/28 1241) Resp:  [17-20] 19 (02/28 1241) BP: (105-162)/(55-92) 137/80 (02/28 1241) SpO2:  [96 %-100 %]  99 % (02/28 1241)  Psychiatric Specialty Exam:  Presentation  General Appearance: Appropriate for Environment; Casual  Eye Contact:Good  Speech:Clear and Coherent; Normal Rate  Speech Volume:Normal  Handedness:No data recorded  Mood and Affect  Mood:Depressed  Affect:Congruent; Depressed; Tearful   Thought Process  Thought Processes:Coherent; Linear  Descriptions of Associations:Intact  Orientation:Full (Time, Place and Person)  Thought Content:Logical; WDL  History of Schizophrenia/Schizoaffective disorder:No data recorded Duration of Psychotic Symptoms:No data recorded Hallucinations:Hallucinations: None  Ideas of Reference:None  Suicidal Thoughts:Suicidal Thoughts: No  Homicidal Thoughts:Homicidal Thoughts: No   Sensorium  Memory:Immediate Good; Recent Good  Judgment:Good  Insight:Good   Executive Functions  Concentration:Good  Attention Span:Good  [place] of Knowledge:Good  Language:Good   Psychomotor Activity  Psychomotor Activity:Psychomotor  Activity: Normal   Assets  Assets:Communication Skills; Desire for Improvement; Housing; Intimacy; Leisure Time; Social Support; Vocational/Educational   Sleep  Sleep:Sleep: Fair    Physical Exam: Physical Exam ROS Blood pressure 137/80, pulse 83, temperature 98 F (36.7 C), temperature source Oral, resp. rate 19, height 5' (1.524 m), weight 171 lb (77.6 kg), last menstrual period 04/20/2022, SpO2 99 %. Body mass index is 33.4 kg/m.

## 2022-10-25 NOTE — Progress Notes (Signed)
CSW acknowledged consult and met with [name] at bedside to provide mental health resources. CSW introduced self and explained reason for consult. [name] was welcoming and remained engaged with CSW during conversation. CSW inquired about how [name] was doing, [name] reported that she was doing good. CSW offered condolences for [name]'s loss, [name] thanked CSW. CSW asked if [name] was interested PPD education, [name] reported yes.   CSW provided education regarding the baby blues period vs. perinatal mood disorders, discussed treatment and gave resources for mental health follow up if concerns arise.  CSW recommends self-evaluation during the postpartum time period using the New Mom Checklist from Postpartum Progress and encouraged [name] to contact a medical professional if symptoms are noted at any time.    CSW acknowledged that [name] may also experience some symptoms due to grief and normalized feelings that may arise. CSW encouraged [name] to feel her feelings as they arise. CSW asked if [name] had supports, [name] reported yes. [name] presented calm and did not demonstrate any acute mental health signs/symptoms. CSW assessed for safety, [name] denied SI, HI, and domestic violence.   CSW provided therapy resources for the [place], [place] area and [employer] CSW asked if there was anything else that CSW could do to be helpful, [name] reported no additional needs.   Abundio Miu, [place] Worker [facility] Cell#: [phone]

## 2022-10-25 NOTE — Progress Notes (Addendum)
Called by RN due to concern about patient.  Report hallucinations and worsening anxiety.  I had stopped by pt earlier to check on her and she had noted feeling jittering and trouble relaxing.  Advised rest and hydroxyzine.  She had taken Ambien for sleep since she had not slept in over 24hrs; however concern that medication may have worsened her symptoms.  Plan to re-evaluate in am and possible consideration for [facility] consult  Janyth Pupa, DO Attending [place], [place] for [facility], [place]

## 2022-10-25 NOTE — Progress Notes (Signed)
POSTPARTUM PROGRESS NOTE  PPD#1  Subjective:  Nancy Benjamin is a 42 y.o. [id number] s/p SVD at [redacted]w[redacted]d Today she notes that she was able to sleep.  Feeling a little better this am and reporting that she had a nightmare that she just cannot get out of her head. She denies any problems with ambulating, voiding or po intake. Denies nausea or vomiting. Pain controlled.  Lochia minimal Denies fever/chills/chest pain/SOB.  no HA, no blurry vision, noRUQ pain  Objective: Blood pressure 138/87, pulse 73, temperature 98.2 F (36.8 C), temperature source Oral, resp. rate 18, height 5' (1.524 m), weight 77.6 kg, last menstrual period 04/20/2022, SpO2 97 %.  Physical Exam:  General: alert, cooperative and no distress Chest: no respiratory distress Heart: regular rate and rhythm Abdomen: soft, nontender Uterine Fundus: firm, appropriately tender DVT Evaluation: No calf swelling or tenderness Extremities: no edema Skin: warm, dry  Results for orders placed or performed during the hospital encounter of 10/16/22 (from the past 24 hour(s))  CBC with Differential/Platelet     Status: Abnormal   Collection Time: 10/25/22  4:21 AM  Result Value Ref Range   WBC 14.1 (H) 4.0 - 10.5 K/uL   RBC 3.72 (L) 3.87 - 5.11 MIL/uL   Hemoglobin 12.4 12.0 - 15.0 g/dL   HCT 36.4 36.0 - 46.0 %   MCV 97.8 80.0 - 100.0 fL   MCH 33.3 26.0 - 34.0 pg   MCHC 34.1 30.0 - 36.0 g/dL   RDW 12.7 11.5 - 15.5 %   Platelets 171 150 - 400 K/uL   nRBC 0.0 0.0 - 0.2 %   Neutrophils Relative % 74 %   Neutro Abs 10.5 (H) 1.7 - 7.7 K/uL   Lymphocytes Relative 17 %   Lymphs Abs 2.3 0.7 - 4.0 K/uL   Monocytes Relative 5 %   Monocytes Absolute 0.8 0.1 - 1.0 K/uL   Eosinophils Relative 3 %   Eosinophils Absolute 0.4 0.0 - 0.5 K/uL   Basophils Relative 0 %   Basophils Absolute 0.1 0.0 - 0.1 K/uL   Immature Granulocytes 1 %   Abs Immature Granulocytes 0.07 0.00 - 0.07 K/uL  Comprehensive metabolic panel     Status: Abnormal    Collection Time: 10/25/22  4:21 AM  Result Value Ref Range   Sodium 140 135 - 145 mmol/L   Potassium 3.7 3.5 - 5.1 mmol/L   Chloride 108 98 - 111 mmol/L   CO2 22 22 - 32 mmol/L   Glucose, Bld 95 70 - 99 mg/dL   BUN 5 (L) 6 - 20 mg/dL   Creatinine, Ser 0.64 0.44 - 1.00 mg/dL   Calcium 8.7 (L) 8.9 - 10.3 mg/dL   Total Protein 6.6 6.5 - 8.1 g/dL   Albumin 3.0 (L) 3.5 - 5.0 g/dL   AST 34 15 - 41 U/L   ALT 65 (H) 0 - 44 U/L   Alkaline Phosphatase 151 (H) 38 - 126 U/L   Total Bilirubin 0.3 0.3 - 1.2 mg/dL   GFR, Estimated >60 >60 mL/min   Anion gap 10 5 - 15    Assessment/Plan: Nancy Hurleyis a 42y.o. [id number]/p SVD at 266w4dP#1 complicated by: 1) HELLP -labs improving -BP stable- overnight held BP meds.  BP this am 130/80s- will plan to continue Labetalol and vasote -s/p Mag -currently asymptomatic  2) Anxiety -doing better this am -plan to continue with zoloft and hydroxyzine -plan for outpatient follow up  Contraception: to be determined Feeding: n/a  Dispo: If pt stable and doing well s/p breakfast, plan for discharge home today.   LOS: 9 days   Nancy Pupa, DO Faculty Attending, Center for [facility] 10/25/2022, 7:47 AM

## 2022-10-26 DIAGNOSIS — G4701 Insomnia due to medical condition: Secondary | ICD-10-CM

## 2022-10-26 DIAGNOSIS — F4321 Adjustment disorder with depressed mood: Secondary | ICD-10-CM

## 2022-10-26 LAB — BPAM RBC
Blood Product Expiration Date: 202403252359
Blood Product Expiration Date: 202403252359
Unit Type and Rh: 5100
Unit Type and Rh: 5100

## 2022-10-26 LAB — TYPE AND SCREEN
ABO/RH(D): O POS
Antibody Screen: NEGATIVE
Unit division: 0
Unit division: 0

## 2022-10-30 ENCOUNTER — Other Ambulatory Visit: Payer: 59

## 2022-10-30 ENCOUNTER — Encounter: Payer: 59 | Admitting: Obstetrics and Gynecology

## 2022-10-31 ENCOUNTER — Ambulatory Visit: Payer: 59 | Admitting: Women's Health

## 2022-10-31 ENCOUNTER — Ambulatory Visit (INDEPENDENT_AMBULATORY_CARE_PROVIDER_SITE_OTHER): Payer: 59 | Admitting: Women's Health

## 2022-10-31 ENCOUNTER — Encounter: Payer: Self-pay | Admitting: Women's Health

## 2022-10-31 VITALS — BP 115/81 | HR 93 | Ht 60.0 in | Wt 158.0 lb

## 2022-10-31 DIAGNOSIS — Z8759 Personal history of other complications of pregnancy, childbirth and the puerperium: Secondary | ICD-10-CM | POA: Diagnosis not present

## 2022-10-31 DIAGNOSIS — F418 Other specified anxiety disorders: Secondary | ICD-10-CM | POA: Diagnosis not present

## 2022-10-31 DIAGNOSIS — I10 Essential (primary) hypertension: Secondary | ICD-10-CM

## 2022-10-31 MED ORDER — SERTRALINE HCL 50 MG PO TABS: 50.0000 mg | ORAL_TABLET | Freq: Every day | ORAL | 6 refills | Status: DC

## 2022-10-31 NOTE — Progress Notes (Signed)
GYN VISIT Patient name: Nancy Benjamin MRN [id number]  Date of birth: Nov 29, 1980 Chief Complaint:   Postpartum Care (1 wk BP)  History of Present Illness:   Nancy Benjamin is a 42 y.o. [id number] Caucasian female 8d s/p SVB of 26.4wk IUFD,being seen today for bp and mood check. Admitted at 25.4wk w/ severe FGR and REDF w/ subsequent IUFD, baby weighed 12oz (340g). CHTN, currently on labetalol '600mg'$  BID (was rx'd TID, but felt better dropping mid day dose), enalapril '5mg'$  daily. Finished lasix course. On zoloft '25mg'$  and prn hydroxyzine '25mg'$ . Hasn't noticed any improvement w/ zoloft. Took hydroxyzine once before bed and did not help. Does have list of support groups/therapists, but hasn't called any yet. Nancy Benjamin is tomorrow.  No LMP recorded.     07/24/2022    4:00 PM 10/05/2021   11:26 AM  Depression screen PHQ 2/9  Decreased Interest 0 1  Down, Depressed, Hopeless 0 0  PHQ - 2 Score 0 1  Altered sleeping 0 1  Tired, decreased energy 1 1  Change in appetite 0 0  Feeling bad or failure about yourself  0 0  Trouble concentrating 0 0  Moving slowly or fidgety/restless 0 0  Suicidal thoughts 0 0  PHQ-9 Score 1 3        07/24/2022    4:00 PM 10/05/2021   11:26 AM  GAD 7 : Generalized Anxiety Score  Nervous, Anxious, on Edge 0 0  Control/stop worrying 0 0  Worry too much - different things 0 1  Trouble relaxing 0 0  Restless 0 0  Easily annoyed or irritable 0 1  Afraid - awful might happen 0 0  Total GAD 7 Score 0 2     Review of Systems:   Pertinent items are noted in HPI Denies fever/chills, dizziness, headaches, visual disturbances, fatigue, shortness of breath, chest pain, abdominal pain, vomiting, abnormal vaginal discharge/itching/odor/irritation, problems with periods, bowel movements, urination, or intercourse unless otherwise stated above.  Pertinent History Reviewed:  Reviewed past medical,surgical, social, obstetrical and family history.  Reviewed problem list, medications  and allergies. Physical Assessment:   Vitals:   10/31/22 1455  BP: 115/81  Pulse: 93  Weight: 158 lb (71.7 kg)  Height: 5' (1.524 m)  Body mass index is 30.86 kg/m.       Physical Examination:   General appearance: alert, well appearing, and in no distress  Mental status: alert, oriented to person, place, and time  Skin: warm & dry   Cardiovascular: normal heart rate noted  Respiratory: normal respiratory effort, no distress  Abdomen: soft, non-tender   Pelvic: examination not indicated  Extremities: no edema   Chaperone: N/A    No results found for this or any previous visit (from the past 24 hour(s)).  Assessment & Plan:  1) 8d s/p SVB of 26.4wk IUFD (severe FGR and REDF)  2) CHTN> currently well controlled on labetalol '600mg'$  BID and enalapril '5mg'$  daily, continue until pp visit 4/2 w/ Dr. Elonda Husky  3) Dep/anx/grief> increase zoloft to '50mg'$  daily, can take '50mg'$  hydroxyzine if needed qhs prn. Has list of support groups/therapists to call if needed  Meds:  Meds ordered this encounter  Medications   sertraline (ZOLOFT) 50 MG tablet    Sig: Take 1 tablet (50 mg total) by mouth daily.    Dispense:  30 tablet    Refill:  6    No orders of the defined types were placed in this encounter.   Return for As scheduled  4/2 for pp visit.  [place], [facility] 10/31/2022 5:16 PM

## 2022-11-01 ENCOUNTER — Ambulatory Visit: Payer: 59 | Admitting: Obstetrics and Gynecology

## 2022-11-03 ENCOUNTER — Other Ambulatory Visit: Payer: Self-pay | Admitting: *Deleted

## 2022-11-06 ENCOUNTER — Other Ambulatory Visit: Payer: 59

## 2022-11-06 ENCOUNTER — Encounter: Payer: 59 | Admitting: Obstetrics & Gynecology

## 2022-11-07 ENCOUNTER — Encounter: Payer: Self-pay | Admitting: Obstetrics & Gynecology

## 2022-11-13 ENCOUNTER — Other Ambulatory Visit: Payer: 59

## 2022-11-13 ENCOUNTER — Encounter: Payer: 59 | Admitting: Women's Health

## 2022-11-22 ENCOUNTER — Encounter: Payer: Self-pay | Admitting: Obstetrics & Gynecology

## 2022-11-22 ENCOUNTER — Other Ambulatory Visit: Payer: Self-pay | Admitting: Obstetrics & Gynecology

## 2022-11-22 DIAGNOSIS — I10 Essential (primary) hypertension: Secondary | ICD-10-CM

## 2022-11-22 MED ORDER — ENALAPRIL MALEATE 5 MG PO TABS: 5.0000 mg | ORAL_TABLET | Freq: Every day | ORAL | 0 refills | Status: DC

## 2022-11-22 NOTE — Progress Notes (Signed)
Rx sent in for Enalpril until her appt  Janyth Pupa, DO Attending [place], [facility] for [facility], [place]

## 2022-11-24 ENCOUNTER — Telehealth (HOSPITAL_COMMUNITY): Payer: Self-pay

## 2022-11-24 NOTE — Telephone Encounter (Signed)
Preadmission testing 

## 2022-11-27 ENCOUNTER — Other Ambulatory Visit: Payer: 59

## 2022-11-28 ENCOUNTER — Ambulatory Visit (INDEPENDENT_AMBULATORY_CARE_PROVIDER_SITE_OTHER): Payer: 59 | Admitting: Obstetrics & Gynecology

## 2022-11-28 ENCOUNTER — Encounter: Payer: Self-pay | Admitting: Obstetrics & Gynecology

## 2022-11-28 DIAGNOSIS — O34219 Maternal care for unspecified type scar from previous cesarean delivery: Secondary | ICD-10-CM

## 2022-11-28 DIAGNOSIS — Z8759 Personal history of other complications of pregnancy, childbirth and the puerperium: Secondary | ICD-10-CM

## 2022-11-28 MED ORDER — NORETHINDRONE 0.35 MG PO TABS: 1.0000 | ORAL_TABLET | Freq: Every day | ORAL | 11 refills | Status: DC

## 2022-11-28 NOTE — Progress Notes (Signed)
Subjective:     Nancy Benjamin is a 42 y.o. female who presents for a postpartum visit. She is 6 weeks postpartum following a spontaneous vaginal delivery. I have fully reviewed the prenatal and intrapartum course. The delivery was at 26 gestational weeks. Outcome: vaginal delivery of a non viable fetus. Anesthesia: epidural. Postpartum course has been unremarkable, BP management. Bowel function is normal. Bladder function is normal. Patient is not sexually active. Contraception method is none. Postpartum depression screening: negative.  The following portions of the patient's history were reviewed and updated as appropriate: allergies, current medications, past family history, past medical history, past social history, past surgical history, and problem list.  Review of Systems Pertinent items are noted in HPI.   Objective:    BP (!) 139/96 (BP Location: Left Arm, Patient Position: Sitting, Cuff Size: Normal)   Pulse 71   Ht 5' (1.524 m)   Wt 162 lb (73.5 kg)   Breastfeeding No   BMI 31.64 kg/m   General:  alert, cooperative, and no distress   Breasts:    Lungs:   Heart:    Abdomen: soft, non-tender; bowel sounds normal; no masses,  no organomegaly   Vulva:  normal  Vagina: normal vagina  Cervix:  no cervical motion tenderness and no lesions  Corpus: normal size, contour, position, consistency, mobility, non-tender  Adnexa:  normal adnexa  Rectal Exam:         Assessment:    normal postpartum exam. Pap smear not done at today's visit.   Plan:    1. Contraception: oral progesterone-only contraceptive 2. Continue labetalol + enalapril 3. Follow up in:  prn   or as needed.

## 2022-11-30 ENCOUNTER — Other Ambulatory Visit: Payer: Self-pay | Admitting: Women's Health

## 2022-12-04 ENCOUNTER — Other Ambulatory Visit: Payer: 59

## 2022-12-11 ENCOUNTER — Other Ambulatory Visit: Payer: 59

## 2022-12-18 ENCOUNTER — Other Ambulatory Visit: Payer: Self-pay | Admitting: *Deleted

## 2022-12-18 ENCOUNTER — Other Ambulatory Visit: Payer: 59

## 2022-12-18 DIAGNOSIS — I10 Essential (primary) hypertension: Secondary | ICD-10-CM

## 2022-12-18 MED ORDER — ENALAPRIL MALEATE 5 MG PO TABS
ORAL_TABLET | ORAL | 11 refills | Status: DC
Start: 2022-12-18 — End: 2023-01-09

## 2022-12-25 ENCOUNTER — Other Ambulatory Visit: Payer: 59

## 2023-01-01 ENCOUNTER — Other Ambulatory Visit: Payer: 59

## 2023-01-08 ENCOUNTER — Other Ambulatory Visit: Payer: 59

## 2023-01-09 ENCOUNTER — Encounter: Payer: Self-pay | Admitting: Obstetrics & Gynecology

## 2023-01-09 ENCOUNTER — Telehealth (INDEPENDENT_AMBULATORY_CARE_PROVIDER_SITE_OTHER): Payer: 59 | Admitting: Obstetrics & Gynecology

## 2023-01-09 VITALS — BP 137/97

## 2023-01-09 DIAGNOSIS — I10 Essential (primary) hypertension: Secondary | ICD-10-CM

## 2023-01-09 MED ORDER — HYDROCHLOROTHIAZIDE 12.5 MG PO TABS: 12.5000 mg | ORAL_TABLET | Freq: Every day | ORAL | 3 refills | Status: DC

## 2023-01-09 MED ORDER — ENALAPRIL MALEATE 10 MG PO TABS: 10.0000 mg | ORAL_TABLET | Freq: Every day | ORAL | 2 refills | Status: DC

## 2023-01-09 NOTE — Progress Notes (Signed)
Follow up appointment for: Hypertesnion management  Chief Complaint  Patient presents with   Follow-up    Blood pressure (!) 137/97, last menstrual period 12/25/2022, not currently breastfeeding.  Overall BP 130s- low 140s over 80-90s  No headaches chronically or other complaints  MEDS ordered this encounter: Meds ordered this encounter  Medications   enalapril (VASOTEC) 10 MG tablet    Sig: Take 1 tablet (10 mg total) by mouth daily.    Dispense:  30 tablet    Refill:  2   hydrochlorothiazide (HYDRODIURIL) 12.5 MG tablet    Sig: Take 1 tablet (12.5 mg total) by mouth daily.    Dispense:  30 tablet    Refill:  3    Orders for this encounter: No orders of the defined types were placed in this encounter.   Impression + Management Plan   ICD-10-CM   1. Essential hypertension  I10    stopping labetalol today,   ^vasotec to 10 mg adding HCTZ 12.5, follow up 6 weeks MyChart      Follow Up: Return in about 6 weeks (around 02/20/2023) for MyChart Connect visit, Follow up, with Dr Despina Hidden.     All questions were answered.  Past Medical History:  Diagnosis Date   Diabetes mellitus without complication (HCC)    gestational   Hypertension    patient reports htn since "mid 20's"    Past Surgical History:  Procedure Laterality Date   CESAREAN SECTION N/A 05/01/2020   Procedure: CESAREAN SECTION;  Surgeon: Hermina Staggers, MD;  Location: [facility];  Service: Obstetrics;  Laterality: N/A;   WISDOM TOOTH EXTRACTION      OB History     Gravida  2   Para  2   Term      Preterm  2   AB      Living  1      SAB      IAB      Ectopic      Multiple  0   Live Births  1           Allergies  Allergen Reactions   Nickel Rash   Other Rash    Robitussin     Social History   Socioeconomic History   Marital status: Married    Spouse name: Not on file   Number of children: Not on file   Years of education: Not on file   Highest education level: Not  on file  Occupational History   Not on file  Tobacco Use   Smoking status: Former    Types: Cigarettes   Smokeless tobacco: Never  Vaping Use   Vaping Use: Never used  Substance and Sexual Activity   Alcohol use: Not Currently   Drug use: Never   Sexual activity: Not Currently    Birth control/protection: None  Other Topics Concern   Not on file  Social History Narrative   Not on file   Social Determinants of Health   Financial Resource Strain: Low Risk  (06/19/2022)   Overall Financial Resource Strain (CARDIA)    Difficulty of Paying Living Expenses: Not very hard  Food Insecurity: No Food Insecurity (10/16/2022)   Hunger Vital Sign    Worried About Running Out of Food in the Last Year: Never true    Ran Out of Food in the Last Year: Never true  Transportation Needs: No Transportation Needs (10/16/2022)   PRAPARE - Administrator, Civil Service (Medical):  No    Lack of Transportation (Non-Medical): No  Physical Activity: Insufficiently Active (06/19/2022)   Exercise Vital Sign    Days of Exercise per Week: 5 days    Minutes of Exercise per Session: 10 min  Stress: No Stress Concern Present (06/19/2022)   [employer] of Occupational Health - Occupational Stress Questionnaire    Feeling of Stress : Only a little  Social Connections: Socially Integrated (06/19/2022)   Social Connection and Isolation Panel [NHANES]    Frequency of Communication with Friends and Family: Twice a week    Frequency of Social Gatherings with Friends and Family: Once a week    Attends Religious Services: More than 4 times per year    Active Member of [employer] or Organizations: Yes    Attends Banker Meetings: 1 to 4 times per year    Marital Status: Married    Family History  Problem Relation Age of Onset   Diabetes Mother    Irritable bowel syndrome Mother    Asthma Father    Hypertension Father    Prostate cancer Father    Heart attack Maternal Grandfather     Prostate cancer Paternal Grandfather

## 2023-01-15 ENCOUNTER — Other Ambulatory Visit: Payer: 59

## 2023-01-18 ENCOUNTER — Encounter: Payer: Self-pay | Admitting: Obstetrics & Gynecology

## 2023-02-19 ENCOUNTER — Telehealth: Payer: 59 | Admitting: Obstetrics & Gynecology

## 2023-02-20 ENCOUNTER — Encounter: Payer: Self-pay | Admitting: Obstetrics & Gynecology

## 2023-02-20 ENCOUNTER — Telehealth (INDEPENDENT_AMBULATORY_CARE_PROVIDER_SITE_OTHER): Payer: 59 | Admitting: Obstetrics & Gynecology

## 2023-02-20 VITALS — BP 113/85 | HR 89

## 2023-02-20 DIAGNOSIS — I10 Essential (primary) hypertension: Secondary | ICD-10-CM | POA: Diagnosis not present

## 2023-02-20 MED ORDER — ENALAPRIL MALEATE 10 MG PO TABS: 10.0000 mg | ORAL_TABLET | Freq: Every day | ORAL | 11 refills | Status: DC

## 2023-02-20 MED ORDER — HYDROCHLOROTHIAZIDE 12.5 MG PO TABS: 12.5000 mg | ORAL_TABLET | Freq: Every day | ORAL | 11 refills | Status: DC

## 2023-02-20 NOTE — Progress Notes (Signed)
MyChart video visit: Pt is at work I am in my office 10 minutes total time  Follow up appointment for results: BP check    Chief Complaint  Patient presents with   Follow-up    Blood pressure 113/85, pulse 89.  Home BP have running stable as well as today's recorded  Pt feels good on this regimen No other compalints  Will refill and continue  MEDS ordered this encounter: Meds ordered this encounter  Medications   enalapril (VASOTEC) 10 MG tablet    Sig: Take 1 tablet (10 mg total) by mouth daily.    Dispense:  30 tablet    Refill:  11   hydrochlorothiazide (HYDRODIURIL) 12.5 MG tablet    Sig: Take 1 tablet (12.5 mg total) by mouth daily.    Dispense:  30 tablet    Refill:  11    Orders for this encounter: No orders of the defined types were placed in this encounter.   Impression + Management Plan   ICD-10-CM   1. Essential hypertension: well controlled  I10    Now on enalapril 10 +HCTZ 12.5      Follow Up: Return if symptoms worsen or fail to improve.     All questions were answered.  Past Medical History:  Diagnosis Date   Diabetes mellitus without complication (HCC)    gestational   Hypertension    patient reports htn since "mid 20's"    Past Surgical History:  Procedure Laterality Date   CESAREAN SECTION N/A 05/01/2020   Procedure: CESAREAN SECTION;  Surgeon: Hermina Staggers, MD;  Location: [facility];  Service: Obstetrics;  Laterality: N/A;   WISDOM TOOTH EXTRACTION      OB History     Gravida  2   Para  2   Term      Preterm  2   AB      Living  1      SAB      IAB      Ectopic      Multiple  0   Live Births  1           Allergies  Allergen Reactions   Nickel Rash   Other Rash    Robitussin     Social History   Socioeconomic History   Marital status: Married    Spouse name: Not on file   Number of children: Not on file   Years of education: Not on file   Highest education level: Not on file   Occupational History   Not on file  Tobacco Use   Smoking status: Former    Types: Cigarettes   Smokeless tobacco: Never  Vaping Use   Vaping Use: Never used  Substance and Sexual Activity   Alcohol use: Not Currently   Drug use: Never   Sexual activity: Not Currently    Birth control/protection: None  Other Topics Concern   Not on file  Social History Narrative   Not on file   Social Determinants of Health   Financial Resource Strain: Low Risk  (06/19/2022)   Overall Financial Resource Strain (CARDIA)    Difficulty of Paying Living Expenses: Not very hard  Food Insecurity: No Food Insecurity (10/16/2022)   Hunger Vital Sign    Worried About Running Out of Food in the Last Year: Never true    Ran Out of Food in the Last Year: Never true  Transportation Needs: No Transportation Needs (10/16/2022)   PRAPARE -  Administrator, Civil Service (Medical): No    Lack of Transportation (Non-Medical): No  Physical Activity: Insufficiently Active (06/19/2022)   Exercise Vital Sign    Days of Exercise per Week: 5 days    Minutes of Exercise per Session: 10 min  Stress: No Stress Concern Present (06/19/2022)   [employer] of Occupational Health - Occupational Stress Questionnaire    Feeling of Stress : Only a little  Social Connections: Socially Integrated (06/19/2022)   Social Connection and Isolation Panel [NHANES]    Frequency of Communication with Friends and Family: Twice a week    Frequency of Social Gatherings with Friends and Family: Once a week    Attends Religious Services: More than 4 times per year    Active Member of [employer] or Organizations: Yes    Attends Banker Meetings: 1 to 4 times per year    Marital Status: Married    Family History  Problem Relation Age of Onset   Diabetes Mother    Irritable bowel syndrome Mother    Asthma Father    Hypertension Father    Prostate cancer Father    Heart attack Maternal Grandfather     Prostate cancer Paternal Grandfather

## 2023-02-26 ENCOUNTER — Encounter: Payer: Self-pay | Admitting: Obstetrics & Gynecology

## 2023-08-16 ENCOUNTER — Encounter: Payer: Self-pay | Admitting: Obstetrics & Gynecology

## 2023-09-05 ENCOUNTER — Other Ambulatory Visit: Payer: Self-pay | Admitting: Obstetrics & Gynecology

## 2023-12-24 ENCOUNTER — Other Ambulatory Visit (HOSPITAL_COMMUNITY)
Admission: RE | Admit: 2023-12-24 | Discharge: 2023-12-24 | Disposition: A | Discharge status: Home / Self Care | Source: Ambulatory Visit | Attending: Obstetrics & Gynecology | Admitting: Obstetrics & Gynecology

## 2023-12-24 ENCOUNTER — Encounter: Payer: Self-pay | Admitting: Obstetrics & Gynecology

## 2023-12-24 ENCOUNTER — Ambulatory Visit (INDEPENDENT_AMBULATORY_CARE_PROVIDER_SITE_OTHER): Admitting: Obstetrics & Gynecology

## 2023-12-24 ENCOUNTER — Other Ambulatory Visit: Payer: Self-pay | Admitting: Women's Health

## 2023-12-24 VITALS — BP 145/101 | HR 118 | Ht 60.0 in | Wt 170.0 lb

## 2023-12-24 DIAGNOSIS — Z01419 Encounter for gynecological examination (general) (routine) without abnormal findings: Secondary | ICD-10-CM

## 2023-12-24 MED ORDER — SERTRALINE HCL 50 MG PO TABS
50.0000 mg | ORAL_TABLET | Freq: Every day | ORAL | 3 refills | Status: AC
Start: 2023-12-24 — End: ?

## 2023-12-24 MED ORDER — ENALAPRIL MALEATE 10 MG PO TABS: 10.0000 mg | ORAL_TABLET | Freq: Every day | ORAL | 11 refills | Status: AC

## 2023-12-24 MED ORDER — NORETHINDRONE 0.35 MG PO TABS: 1.0000 | ORAL_TABLET | Freq: Every day | ORAL | 3 refills | Status: AC

## 2023-12-24 MED ORDER — HYDROCHLOROTHIAZIDE 12.5 MG PO TABS
12.5000 mg | ORAL_TABLET | Freq: Every day | ORAL | 11 refills | Status: AC
Start: 2023-12-24 — End: ?

## 2023-12-24 NOTE — Progress Notes (Signed)
 Subjective:     Nancy Benjamin is a 44 y.o. female here for a routine exam.  Patient's last menstrual period was 12/03/2023 (approximate). [id number] Birth Control Method:  POP Menstrual Calendar(currently): regular  Current complaints: none.   Current acute medical issues:  ^BP   Recent Gynecologic History Patient's last menstrual period was 12/03/2023 (approximate). Last Pap: 2023,  normal Last mammogram: na    Past Medical History:  Diagnosis Date   Diabetes mellitus without complication (HCC)    gestational   High cholesterol    Hypertension    patient reports htn since "mid 20's"   Vitamin D deficiency     Past Surgical History:  Procedure Laterality Date   CESAREAN SECTION N/A 05/01/2020   Procedure: CESAREAN SECTION;  Surgeon: Othelia Blinks, MD;  Location: [facility];  Service: Obstetrics;  Laterality: N/A;   WISDOM TOOTH EXTRACTION      OB History     Gravida  2   Para  2   Term      Preterm  2   AB      Living  1      SAB      IAB      Ectopic      Multiple  0   Live Births  1           Social History   Socioeconomic History   Marital status: Married    Spouse name: Not on file   Number of children: Not on file   Years of education: Not on file   Highest education level: Not on file  Occupational History   Not on file  Tobacco Use   Smoking status: Former    Types: Cigarettes   Smokeless tobacco: Never  Vaping Use   Vaping status: Never Used  Substance and Sexual Activity   Alcohol use: Yes   Drug use: Never   Sexual activity: Yes    Birth control/protection: Pill  Other Topics Concern   Not on file  Social History Narrative   Not on file   Social Drivers of Health   Financial Resource Strain: Low Risk  (12/24/2023)   Overall Financial Resource Strain (CARDIA)    Difficulty of Paying Living Expenses: Not very hard  Food Insecurity: No Food Insecurity (12/24/2023)   Hunger Vital Sign    Worried About Running Out of  Food in the Last Year: Never true    Ran Out of Food in the Last Year: Never true  Transportation Needs: No Transportation Needs (12/24/2023)   PRAPARE - Administrator, Civil Service (Medical): No    Lack of Transportation (Non-Medical): No  Physical Activity: Insufficiently Active (12/24/2023)   Exercise Vital Sign    Days of Exercise per Week: 1 day    Minutes of Exercise per Session: 10 min  Stress: No Stress Concern Present (12/24/2023)   [employer] of Occupational Health - Occupational Stress Questionnaire    Feeling of Stress : Only a little  Social Connections: Socially Integrated (12/24/2023)   Social Connection and Isolation Panel [NHANES]    Frequency of Communication with Friends and Family: Twice a week    Frequency of Social Gatherings with Friends and Family: Once a week    Attends Religious Services: More than 4 times per year    Active Member of [employer] or Organizations: Yes    Attends Banker Meetings: More than 4 times per year    Marital Status:  Married    Family History  Problem Relation Age of Onset   Diabetes Mother    Irritable bowel syndrome Mother    Asthma Father    Hypertension Father    Prostate cancer Father    Heart attack Maternal Grandfather    Prostate cancer Paternal Grandfather      Current Outpatient Medications:    acetaminophen  (TYLENOL ) 325 MG tablet, Take 2 tablets (650 mg total) by mouth every 4 (four) hours as needed (for pain scale < 4)., Disp: , Rfl:    Cholecalciferol (VITAMIN D) 50 MCG (2000 UT) tablet, Take 2,000 Units by mouth daily., Disp: , Rfl:    Loratadine  (CLARITIN  PO), Take by mouth., Disp: , Rfl:    Multiple Vitamin (MULTIVITAMIN) tablet, Take 1 tablet by mouth daily., Disp: , Rfl:    omeprazole  (PRILOSEC  OTC) 20 MG tablet, Take 20 mg by mouth in the morning and at bedtime., Disp: , Rfl:    rosuvastatin (CRESTOR) 10 MG tablet, Take 10 mg by mouth at bedtime., Disp: , Rfl:    enalapril  (VASOTEC )  10 MG tablet, Take 1 tablet (10 mg total) by mouth daily., Disp: 30 tablet, Rfl: 11   hydrochlorothiazide  (HYDRODIURIL ) 12.5 MG tablet, Take 1 tablet (12.5 mg total) by mouth daily., Disp: 30 tablet, Rfl: 11   hydrOXYzine  (ATARAX ) 25 MG tablet, Take 1 tablet (25 mg total) by mouth 3 (three) times daily as needed for anxiety. (Patient not taking: Reported on 12/24/2023), Disp: 30 tablet, Rfl: 0   norethindrone  (MICRONOR ) 0.35 MG tablet, Take 1 tablet (0.35 mg total) by mouth daily., Disp: 84 tablet, Rfl: 3   sertraline  (ZOLOFT ) 50 MG tablet, Take 1 tablet (50 mg total) by mouth daily., Disp: 90 tablet, Rfl: 3  Review of Systems  Review of Systems  Constitutional: Negative for fever, chills, weight loss, malaise/fatigue and diaphoresis.  HENT: Negative for hearing loss, ear pain, nosebleeds, congestion, sore throat, neck pain, tinnitus and ear discharge.   Eyes: Negative for blurred vision, double vision, photophobia, pain, discharge and redness.  Respiratory: Negative for cough, hemoptysis, sputum production, shortness of breath, wheezing and stridor.   Cardiovascular: Negative for chest pain, palpitations, orthopnea, claudication, leg swelling and PND.  Gastrointestinal: negative for abdominal pain. Negative for heartburn, nausea, vomiting, diarrhea, constipation, blood in stool and melena.  Genitourinary: Negative for dysuria, urgency, frequency, hematuria and flank pain.  Musculoskeletal: Negative for myalgias, back pain, joint pain and falls.  Skin: Negative for itching and rash.  Neurological: Negative for dizziness, tingling, tremors, sensory change, speech change, focal weakness, seizures, loss of consciousness, weakness and headaches.  Endo/Heme/Allergies: Negative for environmental allergies and polydipsia. Does not bruise/bleed easily.  Psychiatric/Behavioral: Negative for depression, suicidal ideas, hallucinations, memory loss and substance abuse. The patient is not nervous/anxious and  does not have insomnia.        Objective:  Blood pressure (!) 139/99, pulse 93, height 5' (1.524 m), weight 170 lb (77.1 kg), last menstrual period 12/03/2023.   Physical Exam  Vitals reviewed. Constitutional: She is oriented to person, place, and time. She appears well-developed and well-nourished.  HENT:  Head: Normocephalic and atraumatic.        Right Ear: External ear normal.  Left Ear: External ear normal.  Nose: Nose normal.  Mouth/Throat: Oropharynx is clear and moist.  Eyes: Conjunctivae and EOM are normal. Pupils are equal, round, and reactive to light. Right eye exhibits no discharge. Left eye exhibits no discharge. No scleral icterus.  Neck: Normal range of  motion. Neck supple. No tracheal deviation present. No thyromegaly present.  Cardiovascular: Normal rate, regular rhythm, normal heart sounds and intact distal pulses.  Exam reveals no gallop and no friction rub.   No murmur heard. Respiratory: Effort normal and breath sounds normal. No respiratory distress. She has no wheezes. She has no rales. She exhibits no tenderness.  GI: Soft. Bowel sounds are normal. She exhibits no distension and no mass. There is no tenderness. There is no rebound and no guarding.  Genitourinary:  Breasts no masses skin changes or nipple changes bilaterally      Vulva is normal without lesions Vagina is pink moist without discharge Cervix normal in appearance and pap is done Uterus is normal size shape and contour Adnexa is negative with normal sized ovaries   Musculoskeletal: Normal range of motion. She exhibits no edema and no tenderness.  Neurological: She is alert and oriented to person, place, and time. She has normal reflexes. She displays normal reflexes. No cranial nerve deficit. She exhibits normal muscle tone. Coordination normal.  Skin: Skin is warm and dry. No rash noted. No erythema. No pallor.  Psychiatric: She has a normal mood and affect. Her behavior is normal. Judgment and  thought content normal.       Medications Ordered at today's visit: Meds ordered this encounter  Medications   enalapril  (VASOTEC ) 10 MG tablet    Sig: Take 1 tablet (10 mg total) by mouth daily.    Dispense:  30 tablet    Refill:  11   hydrochlorothiazide  (HYDRODIURIL ) 12.5 MG tablet    Sig: Take 1 tablet (12.5 mg total) by mouth daily.    Dispense:  30 tablet    Refill:  11   norethindrone  (MICRONOR ) 0.35 MG tablet    Sig: Take 1 tablet (0.35 mg total) by mouth daily.    Dispense:  84 tablet    Refill:  3   sertraline  (ZOLOFT ) 50 MG tablet    Sig: Take 1 tablet (50 mg total) by mouth daily.    Dispense:  90 tablet    Refill:  3    Other orders placed at today's visit: No orders of the defined types were placed in this encounter.    ASSESSMENT + PLAN:    ICD-10-CM   1. Well woman exam with routine gynecological exam  Z01.419     2. Encounter for gynecological examination with Papanicolaou smear of cervix  Z01.419 Cytology - PAP( [place])          Return in about 3 years (around 12/24/2026) for yearly.

## 2023-12-27 LAB — CYTOLOGY - PAP
Comment: NEGATIVE
Diagnosis: NEGATIVE
High risk HPV: NEGATIVE
